# Patient Record
Sex: Male | Born: 1965 | Race: Black or African American | Hispanic: No | Marital: Single | Smoking: Never smoker
Health system: Southern US, Community
[De-identification: ages and names within clinical notes are randomized; demographics above are authoritative.]

## PROBLEM LIST (undated history)

## (undated) DIAGNOSIS — I10 Essential (primary) hypertension: Secondary | ICD-10-CM

## (undated) HISTORY — PX: OTHER SURGICAL HISTORY: SHX169

---

## 1997-08-31 ENCOUNTER — Emergency Department (HOSPITAL_COMMUNITY): Admission: EM | Admit: 1997-08-31 | Discharge: 1997-08-31 | Payer: Self-pay | Admitting: Emergency Medicine

## 1997-09-01 ENCOUNTER — Emergency Department (HOSPITAL_COMMUNITY): Admission: EM | Admit: 1997-09-01 | Discharge: 1997-09-01 | Payer: Self-pay | Admitting: Emergency Medicine

## 2002-01-04 ENCOUNTER — Emergency Department (HOSPITAL_COMMUNITY): Admission: EM | Admit: 2002-01-04 | Discharge: 2002-01-04 | Payer: Self-pay | Admitting: Emergency Medicine

## 2010-03-05 ENCOUNTER — Emergency Department (HOSPITAL_COMMUNITY): Admission: EM | Admit: 2010-03-05 | Discharge: 2010-03-05 | Payer: Self-pay | Admitting: Emergency Medicine

## 2010-08-12 LAB — POCT I-STAT, CHEM 8
Calcium, Ion: 1.21 mmol/L (ref 1.12–1.32)
Chloride: 104 mEq/L (ref 96–112)
Creatinine, Ser: 1.4 mg/dL (ref 0.4–1.5)
Glucose, Bld: 92 mg/dL (ref 70–99)
HCT: 49 % (ref 39.0–52.0)
Potassium: 3.9 mEq/L (ref 3.5–5.1)

## 2010-08-12 LAB — POCT CARDIAC MARKERS: CKMB, poc: <1 ng/mL — ABNORMAL LOW (ref 1.0–8.0)

## 2014-10-21 ENCOUNTER — Ambulatory Visit (INDEPENDENT_AMBULATORY_CARE_PROVIDER_SITE_OTHER): Payer: Self-pay | Admitting: Emergency Medicine

## 2014-10-21 VITALS — BP 213/133 | HR 97 | Temp 99.0°F | Resp 16 | Ht 66.0 in | Wt 170.1 lb

## 2014-10-21 DIAGNOSIS — I10 Essential (primary) hypertension: Secondary | ICD-10-CM

## 2014-10-21 MED ORDER — LOSARTAN POTASSIUM-HCTZ 100-25 MG PO TABS: 1.0000 | ORAL_TABLET | Freq: Every day | ORAL | Status: DC

## 2014-10-21 NOTE — Patient Instructions (Signed)

## 2014-10-21 NOTE — Progress Notes (Signed)
Subjective:  Patient ID: Joseph Adkins, male    DOB: 1966/03/16  Age: 49 y.o. MRN: [id number]  CC: Tingling Down Side Of Legs; Hypertension; and Pain in Joints   HPI Joseph Adkins presents for evaluation of a tick bite occurred 2 weeks ago. He attributes some intermittent tingling in his legs as a sequelae of his tick bite. Had no health maintenance in several years administered doctor in several years has not had any recent lab work he does have a family history is very strong for hypertension and diabetes. He is a nonsmoker. Denies any chest pain, tightness, heaviness, pressure or shortness of breath.  He denies any nausea vomiting or stool change. He is unaware of ever having been diagnosed with hypertension. Has no diabetes or hyperlipidemia.  He has no neurologic or visual symptoms no weakness or visual change has no headaches.  Regarding the tick bite that was 2 weeks ago he has not had a rash, fever or chills. Head and has no other systemic symptoms History Joseph Adkins has no past medical history on file.   He has no past surgical history on file.   His family history includes Diabetes in his maternal grandfather, maternal grandmother, paternal grandfather, and paternal grandmother; Hyperlipidemia in his maternal grandfather; Hypertension in his maternal grandfather and paternal grandfather.He reports that he has never smoked. He has never used smokeless tobacco. He reports that he drinks alcohol. He reports that he does not use illicit drugs.  No outpatient prescriptions prior to visit.   No facility-administered medications prior to visit.    ROS Review of Systems  Constitutional: Negative for fever, chills and appetite change.  HENT: Negative for congestion, ear pain, postnasal drip, sinus pressure and sore throat.   Eyes: Negative for pain and redness.  Respiratory: Negative for cough, shortness of breath and wheezing.   Cardiovascular: Negative for leg swelling.    Gastrointestinal: Negative for nausea, vomiting, abdominal pain, diarrhea, constipation and blood in stool.  Endocrine: Negative for polyuria.  Genitourinary: Negative for dysuria, urgency, frequency and flank pain.  Musculoskeletal: Negative for gait problem.  Skin: Negative for rash.  Neurological: Negative for weakness and headaches.  Psychiatric/Behavioral: Negative for confusion and decreased concentration. The patient is not nervous/anxious.     Objective:  BP 213/133 mmHg  Pulse 97  Temp(Src) 99 F (37.2 C) (Oral)  Resp 16  Ht 5\' 6"  (1.676 m)  Wt 170 lb 2 oz (77.168 kg)  BMI 27.47 kg/m2  SpO2 99%  Physical Exam  Constitutional: He is oriented to person, place, and time. He appears well-developed and well-nourished. No distress.  HENT:  Head: Normocephalic and atraumatic.  Right Ear: External ear normal.  Left Ear: External ear normal.  Nose: Nose normal.  Eyes: Conjunctivae and EOM are normal. Pupils are equal, round, and reactive to light. No scleral icterus.  Neck: Normal range of motion. Neck supple. No tracheal deviation present.  Cardiovascular: Normal rate, regular rhythm and normal heart sounds.   Pulmonary/Chest: Effort normal. No respiratory distress. He has no wheezes. He has no rales.  Abdominal: He exhibits no mass. There is no tenderness. There is no rebound and no guarding.  Musculoskeletal: He exhibits no edema.  Lymphadenopathy:    He has no cervical adenopathy.  Neurological: He is alert and oriented to person, place, and time. Coordination normal.  Skin: Skin is warm and dry. No rash noted.  Psychiatric: He has a normal mood and affect. His behavior is normal.  Assessment & Plan:   Joseph Adkins was seen today for tingling down side of legs, hypertension and pain in joints.  Diagnoses and all orders for this visit:  Essential hypertension, malignant Orders: -     CBC -     Comprehensive metabolic panel -     TSH -     Lipid panel  Other  orders -     losartan-hydrochlorothiazide (HYZAAR) 100-25 MG per tablet; Take 1 tablet by mouth daily.   I am having Joseph Adkins start on losartan-hydrochlorothiazide.  Meds ordered this encounter  Medications  . losartan-hydrochlorothiazide (HYZAAR) 100-25 MG per tablet    Sig: Take 1 tablet by mouth daily.    Dispense:  30 tablet    Refill:  3   Red flag symptoms were discussed with him as well as appropriate action taken.  Follow-up: Return in about 2 weeks (around 11/04/2014).  Roselee Culver, MD

## 2014-10-22 LAB — CBC
HEMATOCRIT: 39.8 % (ref 39.0–52.0)
Hemoglobin: 13.7 g/dL (ref 13.0–17.0)
MCH: 32 pg (ref 26.0–34.0)
MCHC: 34.4 g/dL (ref 30.0–36.0)
MCV: 93 fL (ref 78.0–100.0)
MPV: 9.2 fL (ref 8.6–12.4)
PLATELETS: 313 10*3/uL (ref 150–400)
RBC: 4.28 MIL/uL (ref 4.22–5.81)
RDW: 13 % (ref 11.5–15.5)
WBC: 4.9 10*3/uL (ref 4.0–10.5)

## 2014-10-22 LAB — COMPREHENSIVE METABOLIC PANEL
ALBUMIN: 4.3 g/dL (ref 3.5–5.2)
ALT: 33 U/L (ref 0–53)
AST: 25 U/L (ref 0–37)
Alkaline Phosphatase: 58 U/L (ref 39–117)
BUN: 16 mg/dL (ref 6–23)
CALCIUM: 9.2 mg/dL (ref 8.4–10.5)
CO2: 25 mEq/L (ref 19–32)
CREATININE: 1.34 mg/dL (ref 0.50–1.35)
Chloride: 105 mEq/L (ref 96–112)
GLUCOSE: 107 mg/dL — AB (ref 70–99)
POTASSIUM: 4 meq/L (ref 3.5–5.3)
Sodium: 140 mEq/L (ref 135–145)
TOTAL PROTEIN: 7.7 g/dL (ref 6.0–8.3)
Total Bilirubin: 0.7 mg/dL (ref 0.2–1.2)

## 2014-10-22 LAB — LIPID PANEL
CHOL/HDL RATIO: 3 ratio
Cholesterol: 196 mg/dL (ref 0–200)
HDL: 65 mg/dL (ref >=40)
LDL Cholesterol: 115 mg/dL — ABNORMAL HIGH (ref 0–99)
TRIGLYCERIDES: 82 mg/dL (ref <150)
VLDL: 16 mg/dL (ref 0–40)

## 2014-10-22 LAB — TSH: TSH: 4.407 u[IU]/mL (ref 0.350–4.500)

## 2015-02-24 ENCOUNTER — Other Ambulatory Visit: Payer: Self-pay | Admitting: Emergency Medicine

## 2015-03-23 ENCOUNTER — Other Ambulatory Visit: Payer: Self-pay | Admitting: Physician Assistant

## 2015-04-27 ENCOUNTER — Other Ambulatory Visit: Payer: Self-pay | Admitting: Physician Assistant

## 2015-05-23 ENCOUNTER — Other Ambulatory Visit: Payer: Self-pay | Admitting: Physician Assistant

## 2015-06-23 ENCOUNTER — Ambulatory Visit (INDEPENDENT_AMBULATORY_CARE_PROVIDER_SITE_OTHER): Payer: Self-pay | Admitting: Family Medicine

## 2015-06-23 VITALS — BP 168/108 | HR 86 | Temp 97.8°F | Resp 18 | Ht 66.0 in | Wt 176.2 lb

## 2015-06-23 DIAGNOSIS — I10 Essential (primary) hypertension: Secondary | ICD-10-CM

## 2015-06-23 DIAGNOSIS — H0260 Xanthelasma of unspecified eye, unspecified eyelid: Secondary | ICD-10-CM

## 2015-06-23 DIAGNOSIS — E755 Other lipid storage disorders: Secondary | ICD-10-CM

## 2015-06-23 DIAGNOSIS — Z23 Encounter for immunization: Secondary | ICD-10-CM

## 2015-06-23 DIAGNOSIS — Z1211 Encounter for screening for malignant neoplasm of colon: Secondary | ICD-10-CM

## 2015-06-23 LAB — COMPREHENSIVE METABOLIC PANEL
ALK PHOS: 42 U/L (ref 40–115)
ALT: 19 U/L (ref 9–46)
AST: 17 U/L (ref 10–35)
Albumin: 4.3 g/dL (ref 3.6–5.1)
BILIRUBIN TOTAL: 0.6 mg/dL (ref 0.2–1.2)
BUN: 15 mg/dL (ref 7–25)
CO2: 27 mmol/L (ref 20–31)
CREATININE: 1.23 mg/dL (ref 0.70–1.33)
Calcium: 9.4 mg/dL (ref 8.6–10.3)
Chloride: 100 mmol/L (ref 98–110)
GLUCOSE: 122 mg/dL — AB (ref 65–99)
Potassium: 4.6 mmol/L (ref 3.5–5.3)
SODIUM: 137 mmol/L (ref 135–146)
Total Protein: 7.2 g/dL (ref 6.1–8.1)

## 2015-06-23 LAB — LIPID PANEL
Cholesterol: 204 mg/dL — ABNORMAL HIGH (ref 125–200)
HDL: 76 mg/dL (ref >=40)
LDL CALC: 119 mg/dL (ref <130)
Total CHOL/HDL Ratio: 2.7 Ratio (ref <=5.0)
Triglycerides: 47 mg/dL (ref <150)
VLDL: 9 mg/dL (ref <30)

## 2015-06-23 MED ORDER — LOSARTAN POTASSIUM-HCTZ 100-25 MG PO TABS: ORAL_TABLET | ORAL | Status: DC

## 2015-06-23 NOTE — Progress Notes (Signed)
Patient ID: Joseph Adkins, male    DOB: [birth year]  Age: 50 y.o. MRN: [id number]  Chief Complaint  Patient presents with  . Medication Refill    hyzaar    Subjective:   50 year old man who is here for his blood pressure medication. It ran out 2 days ago. He didn't get in yesterday due to the big crowd. 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 He gets regular exercise. He checks his blood pressure frequently, and usually runs in the 130s over 80s to 90. He feels well and has no major complaints. Cardiovascular, respiratory, GI, GU, neurologic, all essentially normal. He has never had a colonoscopy and knows that he needs to get one some time. He drinks occasional, does not smoke or use drugs. Does not consider himself at any risk of STDs. His labs were all good almost a year ago.  Current allergies, medications, problem list, past/family and social histories reviewed.  Objective:  BP 168/108 mmHg  Pulse 86  Temp(Src) 97.8 F (36.6 C) (Oral)  Resp 18  Ht 5\' 6"  (1.676 m)  Wt 176 lb 3.2 oz (79.924 kg)  BMI 28.45 kg/m2  SpO2 98%  No major acute distress. HEENT normal. Chest clear. Heart regular without murmur. Abdomen soft without mass or tenderness. Extremities without edema.  Assessment & Plan:   Assessment: 1. Essential hypertension   2. Screen for colon cancer   3. Xanthoma of eyelid   4. Needs flu shot       Plan: Get him back on his blood pressure medication and have him monitor it carefully. If it continues to stay will need to alter the medication or add additional med.  Will go ahead and make referral for colonoscopy. Flu shot today.  Orders Placed This Encounter  Procedures  . Flu Vaccine QUAD 36+ mos IM  . Comprehensive metabolic panel  . Lipid panel  . Ambulatory referral to Gastroenterology    Referral Priority:  Routine    Referral Type:  Consultation    Referral Reason:  Specialty Services Required    Number of Visits Requested:  1     No orders of the defined types were placed in this encounter.         Patient Instructions  Take your blood pressure medicine faithfully, losartan HCT 100/25. Monitor your blood pressures. If they are running greater than 140/90 over the next few weeks you need to contact us for a little change of your medication.  Continue regular exercise  Referral will be made for a screening colonoscopy  Flu shot today  Avoid excessive salt  Because the pressure is significantly elevated today I think you should return in about 3 or 4 months for a follow-up visit. In the event of any symptoms since chest pains, excessive headaches, dizziness, etc. please come in sooner.     Return in about 4 months (around 10/21/2015).   Terry Bolotin, MD 06/23/2015

## 2015-06-23 NOTE — Patient Instructions (Addendum)
Take your blood pressure medicine faithfully, losartan HCT 100/25. Monitor your blood pressures. If they are running greater than 140/90 over the next few weeks you need to contact us for a little change of your medication.  Continue regular exercise  Referral will be made for a screening colonoscopy  Flu shot today  Avoid excessive salt  Because the pressure is significantly elevated today I think you should return in about 3 or 4 months for a follow-up visit. In the event of any symptoms since chest pains, excessive headaches, dizziness, etc. please come in sooner.  Continue taking your aspirin a day

## 2016-07-26 ENCOUNTER — Other Ambulatory Visit: Payer: Self-pay | Admitting: Family Medicine

## 2016-07-26 DIAGNOSIS — I10 Essential (primary) hypertension: Secondary | ICD-10-CM

## 2016-10-06 ENCOUNTER — Other Ambulatory Visit: Payer: Self-pay | Admitting: Physician Assistant

## 2016-10-06 DIAGNOSIS — I10 Essential (primary) hypertension: Secondary | ICD-10-CM

## 2016-11-09 ENCOUNTER — Other Ambulatory Visit: Payer: Self-pay | Admitting: Physician Assistant

## 2016-11-09 DIAGNOSIS — I10 Essential (primary) hypertension: Secondary | ICD-10-CM

## 2016-11-15 NOTE — Telephone Encounter (Signed)
Please call pt and let him know they need an office visit for further refills of their bp medications. She has already been given one courtesy refill and now I have given her 15 tablets so she has enough time to schedule the appointment. Thanks!

## 2017-01-05 ENCOUNTER — Observation Stay (HOSPITAL_COMMUNITY): Payer: Self-pay

## 2017-01-05 ENCOUNTER — Encounter (HOSPITAL_COMMUNITY): Payer: Self-pay | Admitting: Emergency Medicine

## 2017-01-05 ENCOUNTER — Emergency Department (HOSPITAL_COMMUNITY): Payer: Self-pay

## 2017-01-05 ENCOUNTER — Observation Stay (HOSPITAL_COMMUNITY)
Admission: EM | Admit: 2017-01-05 | Discharge: 2017-01-06 | Disposition: A | Discharge status: Home / Self Care | Payer: Self-pay | Attending: Internal Medicine | Admitting: Internal Medicine

## 2017-01-05 DIAGNOSIS — Z79899 Other long term (current) drug therapy: Secondary | ICD-10-CM | POA: Insufficient documentation

## 2017-01-05 DIAGNOSIS — I10 Essential (primary) hypertension: Secondary | ICD-10-CM | POA: Insufficient documentation

## 2017-01-05 DIAGNOSIS — R938 Abnormal findings on diagnostic imaging of other specified body structures: Secondary | ICD-10-CM

## 2017-01-05 DIAGNOSIS — R9431 Abnormal electrocardiogram [ECG] [EKG]: Secondary | ICD-10-CM

## 2017-01-05 DIAGNOSIS — R51 Headache: Secondary | ICD-10-CM

## 2017-01-05 DIAGNOSIS — R55 Syncope and collapse: Principal | ICD-10-CM | POA: Insufficient documentation

## 2017-01-05 HISTORY — DX: Essential (primary) hypertension: I10

## 2017-01-05 LAB — CBC
HCT: 39.7 % (ref 39.0–52.0)
HCT: 41.5 % (ref 39.0–52.0)
HEMOGLOBIN: 14.2 g/dL (ref 13.0–17.0)
Hemoglobin: 14.6 g/dL (ref 13.0–17.0)
MCH: 32.7 pg (ref 26.0–34.0)
MCH: 32.3 pg (ref 26.0–34.0)
MCHC: 35.8 g/dL (ref 30.0–36.0)
MCHC: 35.2 g/dL (ref 30.0–36.0)
MCV: 91.5 fL (ref 78.0–100.0)
MCV: 91.8 fL (ref 78.0–100.0)
PLATELETS: 267 10*3/uL (ref 150–400)
Platelets: 268 10*3/uL (ref 150–400)
RBC: 4.34 MIL/uL (ref 4.22–5.81)
RBC: 4.52 MIL/uL (ref 4.22–5.81)
RDW: 12.9 % (ref 11.5–15.5)
RDW: 13 % (ref 11.5–15.5)
WBC: 5.9 10*3/uL (ref 4.0–10.5)
WBC: 6.1 10*3/uL (ref 4.0–10.5)

## 2017-01-05 LAB — BASIC METABOLIC PANEL
ANION GAP: 8 (ref 5–15)
BUN: 13 mg/dL (ref 6–20)
CO2: 22 mmol/L (ref 22–32)
Calcium: 9.3 mg/dL (ref 8.9–10.3)
Chloride: 107 mmol/L (ref 101–111)
Creatinine, Ser: 1.08 mg/dL (ref 0.61–1.24)
GFR calc Af Amer: >60 mL/min (ref >60)
GLUCOSE: 104 mg/dL — AB (ref 65–99)
POTASSIUM: 3.7 mmol/L (ref 3.5–5.1)
Sodium: 137 mmol/L (ref 135–145)

## 2017-01-05 LAB — CREATININE, SERUM
CREATININE: 1.24 mg/dL (ref 0.61–1.24)
GFR calc Af Amer: >60 mL/min (ref >60)

## 2017-01-05 LAB — TSH: TSH: 3.412 u[IU]/mL (ref 0.350–4.500)

## 2017-01-05 LAB — I-STAT TROPONIN, ED: TROPONIN I, POC: 0 ng/mL (ref 0.00–0.08)

## 2017-01-05 LAB — TROPONIN I

## 2017-01-05 MED ORDER — SODIUM CHLORIDE 0.9 % IV SOLN: 250.0000 mL | INTRAVENOUS | Status: DC | PRN

## 2017-01-05 MED ORDER — SODIUM CHLORIDE 0.9% FLUSH: 3.0000 mL | INTRAVENOUS | Status: DC | PRN

## 2017-01-05 MED ORDER — ENOXAPARIN SODIUM 40 MG/0.4ML ~~LOC~~ SOLN
40.0000 mg | SUBCUTANEOUS | Status: DC
  Administered 2017-01-05: 40 mg via SUBCUTANEOUS
  Filled 2017-01-05: qty 0.4

## 2017-01-05 MED ORDER — LOSARTAN POTASSIUM 50 MG PO TABS
100.0000 mg | ORAL_TABLET | Freq: Every day | ORAL | Status: DC
  Administered 2017-01-06: 100 mg via ORAL
  Filled 2017-01-05: qty 2

## 2017-01-05 MED ORDER — ONDANSETRON HCL 4 MG/2ML IJ SOLN: 4.0000 mg | Freq: Four times a day (QID) | INTRAMUSCULAR | Status: DC | PRN

## 2017-01-05 MED ORDER — HYDROCHLOROTHIAZIDE 25 MG PO TABS
25.0000 mg | ORAL_TABLET | Freq: Every day | ORAL | Status: DC
  Administered 2017-01-06: 25 mg via ORAL
  Filled 2017-01-05: qty 1

## 2017-01-05 MED ORDER — SODIUM CHLORIDE 0.9% FLUSH
3.0000 mL | Freq: Two times a day (BID) | INTRAVENOUS | Status: DC
  Administered 2017-01-06: 3 mL via INTRAVENOUS

## 2017-01-05 MED ORDER — HYDRALAZINE HCL 20 MG/ML IJ SOLN: 10.0000 mg | Freq: Four times a day (QID) | INTRAMUSCULAR | Status: DC | PRN

## 2017-01-05 MED ORDER — ACETAMINOPHEN 650 MG RE SUPP: 650.0000 mg | Freq: Four times a day (QID) | RECTAL | Status: DC | PRN

## 2017-01-05 MED ORDER — LOSARTAN POTASSIUM-HCTZ 100-25 MG PO TABS: 1.0000 | ORAL_TABLET | Freq: Every day | ORAL | Status: DC

## 2017-01-05 MED ORDER — ACETAMINOPHEN 325 MG PO TABS: 650.0000 mg | ORAL_TABLET | Freq: Four times a day (QID) | ORAL | Status: DC | PRN

## 2017-01-05 MED ORDER — SODIUM CHLORIDE 0.9% FLUSH
3.0000 mL | Freq: Two times a day (BID) | INTRAVENOUS | Status: DC
  Administered 2017-01-05: 3 mL via INTRAVENOUS

## 2017-01-05 MED ORDER — ONDANSETRON HCL 4 MG PO TABS: 4.0000 mg | ORAL_TABLET | Freq: Four times a day (QID) | ORAL | Status: DC | PRN

## 2017-01-05 NOTE — ED Notes (Signed)
Patient transported to X-ray 

## 2017-01-05 NOTE — ED Triage Notes (Signed)
Pt reports Friday had near syncopal episode after being outside. Pt felt better. Then had a near syncopal episode on Monday. Today he felt warm and put ice around his neck bc he felt like he was going to pass out again. Pt states he felt better after the ice. No weakness or feeling like he is going to pass out currently.

## 2017-01-05 NOTE — H&P (Signed)
[facility] History and Physical  ZADE FALKNER [id number] DOB: 1965-07-31 DOA: 01/05/2017  PCP: Patient, No Pcp Per  Patient coming from: Home  Chief Complaint: Near syncope  HPI: Joseph Adkins is a 51 year old male with history of hypertension presented to the emergency department with complaints of near syncopal events. Patient states his first event occurred on July 4th while he was at a party. Patient remembers being outside with friends and feeling very hot and warm. Once he was able to sit down and cool off, he no longer felt this faint feeling. Patient does recall having headache prior to this event. Patient also had episode earlier this week on Monday, at which point he was at work. He recalls having a frontal headache, 10/10 pain, prior to the event. He states that he went and sat in the cooler and use an ice pack on his neck at which point this faint feeling resolved. Third faint or near syncopal event occurred yesterday on 01/04/2017. Patient recalls having a mild headache, rated at 2/10, in the frontal region. He remembers having to sit down for a while before his symptoms went away. Patient states his headaches have always been frontal and bilateral, denies any throbbing or sharp pain. Denies any radiation of symptoms. Denies any sensitivity to light or sound. Patient denied any chest pain or shortness of breath, nausea or vomiting, diarrhea with the event. Patient denies any recent changes in medications. He does admit to not taking his hypertensive medications for the last few days as he ran out. Patient denies any recent bowel, ill contacts, illness. Currently denies chest pain, shortness of breath, abdominal pain, nausea or vomiting, diarrhea constipation, changes in urinary habits, dizziness, current headache.   ED Course: Found to have elevated blood pressure with complaints of near syncope. [facility] called for admission.  Review of Systems:  All other systems reviewed  and are negative.   Past Medical History:  Diagnosis Date  . Hypertension     No past surgical history on file.  Social History:  reports that he has never smoked. He has never used smokeless tobacco. He reports that he drinks alcohol. He reports that he does not use drugs.  No Known Allergies  Family History  Problem Relation Age of Onset  . Diabetes Maternal Grandmother   . Diabetes Maternal Grandfather   . Hypertension Maternal Grandfather   . Hyperlipidemia Maternal Grandfather   . Diabetes Paternal Grandmother   . Diabetes Paternal Grandfather   . Hypertension Paternal Grandfather      Prior to Admission medications   Medication Sig Start Date End Date Taking? Authorizing Provider  losartan-hydrochlorothiazide (HYZAAR) 100-25 MG tablet TAKE 1 TABLET BY MOUTH EVERY DAY 11/15/16   Leonie Douglas, PA-C    Physical Exam: Vitals:   01/05/17 1615 01/05/17 1645  BP: (!) 175/97 (!) 170/97  Pulse: 90 97  Resp: 16 17  Temp:    SpO2: 98% 97%    General: Well developed, well nourished, NAD, appears stated age  HEENT: NCAT, PERRLA, EOMI, Anicteic Sclera, mucous membranes moist.   Neck: Supple, no JVD, no masses  Cardiovascular: S1 S2 auscultated, no rubs, murmurs or gallops. Regular rate and rhythm.  Respiratory: Clear to auscultation bilaterally with equal chest rise  Abdomen: Soft, nontender, nondistended, + bowel sounds  Extremities: warm dry without cyanosis clubbing or edema  Neuro: AAOx3, cranial nerves grossly intact. Strength 5/5 in patient's upper and lower extremities bilaterally  Skin: Without rashes exudates or nodules  Psych: Normal affect and demeanor with intact judgement and insight  Labs on Admission: I have personally reviewed following labs and imaging studies CBC:  Recent Labs Lab 01/05/17 1236  WBC 5.9  HGB 14.2  HCT 39.7  MCV 91.5  PLT 154   Basic Metabolic Panel:  Recent Labs Lab 01/05/17 1236  NA 137  K 3.7  CL 107    CO2 22  GLUCOSE 104*  BUN 13  CREATININE 1.08  CALCIUM 9.3   GFR: Estimated Creatinine Clearance: 71.4 mL/min (by C-G formula based on SCr of 1.08 mg/dL). Liver Function Tests: No results for input(s): AST, ALT, ALKPHOS, BILITOT, PROT, ALBUMIN in the last 168 hours. No results for input(s): LIPASE, AMYLASE in the last 168 hours. No results for input(s): AMMONIA in the last 168 hours. Coagulation Profile: No results for input(s): INR, PROTIME in the last 168 hours. Cardiac Enzymes: No results for input(s): CKTOTAL, CKMB, CKMBINDEX, TROPONINI in the last 168 hours. BNP (last 3 results) No results for input(s): PROBNP in the last 8760 hours. HbA1C: No results for input(s): HGBA1C in the last 72 hours. CBG: No results for input(s): GLUCAP in the last 168 hours. Lipid Profile: No results for input(s): CHOL, HDL, LDLCALC, TRIG, CHOLHDL, LDLDIRECT in the last 72 hours. Thyroid Function Tests: No results for input(s): TSH, T4TOTAL, FREET4, T3FREE, THYROIDAB in the last 72 hours. Anemia Panel: No results for input(s): VITAMINB12, FOLATE, FERRITIN, TIBC, IRON, RETICCTPCT in the last 72 hours. Urine analysis: No results found for: COLORURINE, APPEARANCEUR, LABSPEC, PHURINE, GLUCOSEU, HGBUR, BILIRUBINUR, KETONESUR, PROTEINUR, UROBILINOGEN, NITRITE, LEUKOCYTESUR Sepsis Labs: @LABRCNTIP (procalcitonin:4,lacticidven:4) )No results found for this or any previous visit (from the past 240 hour(s)).   Radiological Exams on Admission: Dg Chest 2 View  Result Date: 01/05/2017 CLINICAL DATA:  Faint feelings, near syncopal EXAM: CHEST  2 VIEW COMPARISON:  None FINDINGS: Normal heart size, mediastinal contours, and pulmonary vascularity. Lungs clear. No pleural effusion or pneumothorax. Question subtle LEFT nipple shadow. Bones unremarkable. IMPRESSION: No acute abnormalities. **An incidental finding of potential clinical significance has been found. Question LEFT nipple shadow. Repeat PA chest  radiograph with nipple markers recommended to exclude pulmonary nodule.** Electronically Signed   By: Lavonia Dana M.D.   On: 01/05/2017 16:38    EKG: Independently reviewed. Sinus rhythm, rate 93. Question ST elevations in V1/V2, lateral TWI  Assessment/Plan Near Syncope -Has occurred 3 times since November 30, 2016.  -Patient has not lost consciousness. -Patient does have headache prior to event. -Will admit patient to telemetry. -Continue neuro checks -obtain orthostatic vitals on admission -Will obtain echocardiogram and carotid Doppler -CT head pending  Essential Hypertension/Accelerated Hypertension -Patient ran out of his medication several days ago -Will continue home medication of losartan/HCTZ -Placed on IV hydralazine as needed  Headache -Question whether his headache is due to elevated blood pressure versus possible dehydration -CT head pending -Currently not experiencing headache  Abnormal EKG -Discussed with cardiology, Dr. Oval Linsey. In the absence of chest pain, recommended cycling troponins. Possibly early repolarization.  -Echocardiogram ordered -First troponin negative, will continue to cycle.  -no previous EKG for comparison -Currently denies chest pain  Chest xray irregularity -will obtain repeat CXR in the morning -negative for infiltration or infection -currently no complaints of cough or shortness of breath -radiology reading: No acute abnormalities, question left nipple shadow, recommended repeat chest x-ray with nipple markers   DVT prophylaxis: Lovenox  Code Status: Full  Family Communication: None at bedside. Admission, patients condition and plan of care including  tests being ordered have been discussed with the patient, who indicates understanding and agrees with the plan and Code Status.  Disposition Plan: Home   Consults called: None   Admission status: Observation.    Time spent: 60 minutes  Sonnie Bias D.O. [facility] Pager [phone]  If 7PM-7AM, please contact night-coverage [email or web address] Password [facility] 01/05/2017, 5:36 PM

## 2017-01-05 NOTE — ED Provider Notes (Signed)
Patient is a syncopal event today 11 AM. He felt lightheaded immediately before the event. He also had a near syncopal event yesterday and near syncopal event on Monday, 01/02/2017.Marland Kitchen He had no headache today he did report slight headache preceding the near syncopal events yesterday in 3 days ago. Had severe headache preceding nearsyncopal eventon 11/30/2016. He is presently asymptomatichistory of hypertension. He's been noncompliant with his medications for the past 2 days as he has run outhe has not had a headache at all today. No treatment prior to coming here on exam alert no distresslungs clear auscultation heart regular rate and rhythm no murmurs abdomen nondistended nontender extremities without edema neurologic Glasgow Coma Score 15 cranial nerves II through XII intact motor strength 5 over 5 overall moves all extremities well well   Orlie Dakin, MD 01/05/17 1646

## 2017-01-05 NOTE — ED Notes (Signed)
Attempted report 

## 2017-01-05 NOTE — ED Notes (Signed)
Attempted report x 2 

## 2017-01-05 NOTE — ED Notes (Signed)
Patient transported to CT 

## 2017-01-05 NOTE — ED Provider Notes (Signed)
[facility] DEPT Provider Note   CSN: [id number] Arrival date & time: 01/05/17  1144     History   Chief Complaint Chief Complaint  Patient presents with  . Near Syncope    HPI Joseph Adkins is a 51 y.o. male. With history of HTN who presents with complaints of near syncope. Patient reports several episodes of maximal intensity at onset HA with subsequent sensation of almost passing out. First episode was on 4th of July, patient reports being outdoors after 2 beers and developed 10/10 diffuse headache with subsequent sensation that he was going to pass out. He was with nurses who thought he had heat exhaustion - symptoms improved with resting in cool air conditioning. 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 Describes sudden onset of 6/10 headache with subsequent near syncopal symptoms. Recurrence Wednesday and this morning.  He denies any chest pain, shortness of breath or palpitations. Denies any weakness, or changes in vision. Has had some tingling in his left lower extremity this week. Denies family history of brain disorders/aneurysms. He is currently asymptomatic.   HPI  Past Medical History:  Diagnosis Date  . Hypertension     Patient Active Problem List   Diagnosis Date Noted  . Near syncope 01/05/2017    No past surgical history on file.     Home Medications    Prior to Admission medications   Medication Sig Start Date End Date Taking? Authorizing Provider  aspirin 325 MG tablet Take 325 mg by mouth daily as needed for mild pain or headache.    Yes [provider]  losartan-hydrochlorothiazide (HYZAAR) 100-25 MG tablet TAKE 1 TABLET BY MOUTH EVERY DAY 11/15/16  Yes Leonie Douglas, PA-C    Family History Family History  Problem Relation Age of Onset  . Diabetes Maternal Grandmother   . Diabetes Maternal Grandfather   . Hypertension Maternal Grandfather   . Hyperlipidemia Maternal Grandfather   . Diabetes Paternal  Grandmother   . Diabetes Paternal Grandfather   . Hypertension Paternal Grandfather     Social History Social History  Substance Use Topics  . Smoking status: Never Smoker  . Smokeless tobacco: Never Used  . Alcohol use 0.0 oz/week     Allergies   Patient has no known allergies.   Review of Systems Review of Systems  Constitutional: Negative for chills and fever.  HENT: Negative for ear pain and sore throat.   Eyes: Negative for pain and visual disturbance.  Respiratory: Negative for cough and shortness of breath.   Cardiovascular: Negative for chest pain and palpitations.  Gastrointestinal: Negative for abdominal pain and vomiting.  Genitourinary: Negative for dysuria and hematuria.  Musculoskeletal: Negative for arthralgias and back pain.  Skin: Negative for color change and rash.  Neurological: Positive for syncope (near syncope), light-headedness, numbness (tingling in LLE) and headaches. Negative for seizures.  All other systems reviewed and are negative.    Physical Exam Updated Vital Signs BP (!) 153/92 (BP Location: Right Arm)   Pulse 83   Temp 98.4 F (36.9 C) (Oral)   Resp 16   Ht 5\' 6"  (1.676 m)   Wt 71.5 kg (157 lb 11.2 oz)   SpO2 100%   BMI 25.45 kg/m   Physical Exam  Constitutional: He appears well-developed and well-nourished.  HENT:  Head: Normocephalic and atraumatic.  Eyes: Conjunctivae are normal.  Neck: Neck supple.  Cardiovascular: Normal rate and regular rhythm.   No murmur heard. Pulmonary/Chest: Effort normal  and breath sounds normal. No respiratory distress.  Abdominal: Soft. There is no tenderness.  Musculoskeletal: He exhibits no edema.  Neurological: He is alert. He has normal strength. No cranial nerve deficit or sensory deficit. He displays a negative Romberg sign. Coordination and gait normal. GCS eye subscore is 4. GCS verbal subscore is 5. GCS motor subscore is 6.  Skin: Skin is warm and dry.  Psychiatric: He has a normal  mood and affect.  Nursing note and vitals reviewed.    ED Treatments / Results  Labs (all labs ordered are listed, but only abnormal results are displayed) Labs Reviewed  BASIC METABOLIC PANEL - Abnormal; Notable for the following:       Result Value   Glucose, Bld 104 (*)    All other components within normal limits  CBC  TROPONIN I  CBC  CREATININE, SERUM  TSH  TROPONIN I  TROPONIN I  HIV ANTIBODY (ROUTINE TESTING)  BASIC METABOLIC PANEL  CBC  I-STAT TROPONIN, ED    EKG  EKG Interpretation  Date/Time:  Thursday January 05 2017 12:32:55 EDT Ventricular Rate:  93 PR Interval:  134 QRS Duration: 78 QT Interval:  350 QTC Calculation: 435 R Axis:   -36 Text Interpretation:  Normal sinus rhythm Possible Left atrial enlargement Left axis deviation Septal infarct , age undetermined T wave abnormality, consider lateral ischemia Abnormal ECG No significant change since last tracing Confirmed by Orlie Dakin [phone]) on 01/05/2017 4:37:15 PM       Radiology Dg Chest 2 View  Result Date: 01/05/2017 CLINICAL DATA:  Acute onset of syncope and lightheadedness. Mild headache. Initial encounter. EXAM: CHEST  2 VIEW COMPARISON:  Chest radiograph performed earlier today at 4:22 p.m. FINDINGS: The lungs are well-aerated and clear. There is no evidence of focal opacification, pleural effusion or pneumothorax. The heart is normal in size; the mediastinal contour is within normal limits. No acute osseous abnormalities are seen. IMPRESSION: No acute cardiopulmonary process seen. Electronically Signed   By: Garald Balding M.D.   On: 01/05/2017 21:36   Dg Chest 2 View  Result Date: 01/05/2017 CLINICAL DATA:  Faint feelings, near syncopal EXAM: CHEST  2 VIEW COMPARISON:  None FINDINGS: Normal heart size, mediastinal contours, and pulmonary vascularity. Lungs clear. No pleural effusion or pneumothorax. Question subtle LEFT nipple shadow. Bones unremarkable. IMPRESSION: No acute abnormalities. **An  incidental finding of potential clinical significance has been found. Question LEFT nipple shadow. Repeat PA chest radiograph with nipple markers recommended to exclude pulmonary nodule.** Electronically Signed   By: Lavonia Dana M.D.   On: 01/05/2017 16:38   Ct Head Wo Contrast  Result Date: 01/05/2017 CLINICAL DATA:  Near syncope EXAM: CT HEAD WITHOUT CONTRAST TECHNIQUE: Contiguous axial images were obtained from the base of the skull through the vertex without intravenous contrast. COMPARISON:  None. FINDINGS: Brain: There is no evidence for acute hemorrhage, hydrocephalus, mass lesion, or abnormal extra-axial fluid collection. No definite CT evidence for acute infarction. Vascular: No hyperdense vessel or unexpected calcification. Skull: Normal. Negative for fracture or focal lesion. Sinuses/Orbits: No acute finding. Other: None. IMPRESSION: Unremarkable noncontrast CT exam of the brain. Electronically Signed   By: Misty Stanley M.D.   On: 01/05/2017 18:10    Procedures Procedures (including critical care time)  Medications Ordered in ED Medications  sodium chloride flush (NS) 0.9 % injection 3 mL (not administered)  enoxaparin (LOVENOX) injection 40 mg (40 mg Subcutaneous Given 01/05/17 2245)  sodium chloride flush (NS) 0.9 % injection  3 mL (not administered)  sodium chloride flush (NS) 0.9 % injection 3 mL (not administered)  0.9 %  sodium chloride infusion (not administered)  acetaminophen (TYLENOL) tablet 650 mg (not administered)    Or  acetaminophen (TYLENOL) suppository 650 mg (not administered)  ondansetron (ZOFRAN) tablet 4 mg (not administered)    Or  ondansetron (ZOFRAN) injection 4 mg (not administered)  hydrALAZINE (APRESOLINE) injection 10 mg (not administered)  losartan (COZAAR) tablet 100 mg (not administered)    And  hydrochlorothiazide (HYDRODIURIL) tablet 25 mg (not administered)     Initial Impression / Assessment and Plan / ED Course  I have reviewed the triage  vital signs and the nursing notes.  Pertinent labs & imaging results that were available during my care of the patient were reviewed by me and considered in my medical decision making (see chart for details).     Patient is a 51 y/o male With history of hypertension who presents with several near syncopal events, 3 in the past week. Events have been associated with a severe headache. Patient arrived hypertensive with systolic blood pressure running between 409W to 119J systolic, otherwise hemodynamically stable. He does admit to running out of her pressure medications for the past 2 days. Exam as above, nonfocal neurological exam.  Basic labs reassuring. Chest x-ray without acute findings. EKG with lateral T-wave flattening and flipped T waves, stable from 2011.  Considered subarachnoid hemorrhage, however nonfocal neurological exam, and Near syncopal event today was without a headache. EKG stable, however it is abnormal. Given this is patient's third near syncopal event this week, I think he warrants admission for further syncopal workup including cardiac monitoring, echo, carotid duplex. Discussed with hospitalist for admission.  Patient and plan of care discussed with Attending physician, Dr. Winfred Leeds.      Final Clinical Impressions(s) / ED Diagnoses   Final diagnoses:  Near syncope    New Prescriptions Current Discharge Medication List       Arnetha Massy, MD 01/06/17 0109    Orlie Dakin, MD 01/07/17 [phone]

## 2017-01-06 ENCOUNTER — Encounter (HOSPITAL_COMMUNITY): Payer: Self-pay

## 2017-01-06 ENCOUNTER — Observation Stay (HOSPITAL_BASED_OUTPATIENT_CLINIC_OR_DEPARTMENT_OTHER): Payer: Self-pay

## 2017-01-06 DIAGNOSIS — I5032 Chronic diastolic (congestive) heart failure: Secondary | ICD-10-CM

## 2017-01-06 DIAGNOSIS — R55 Syncope and collapse: Secondary | ICD-10-CM

## 2017-01-06 LAB — BASIC METABOLIC PANEL
ANION GAP: 6 (ref 5–15)
BUN: 14 mg/dL (ref 6–20)
CALCIUM: 9.2 mg/dL (ref 8.9–10.3)
CO2: 28 mmol/L (ref 22–32)
CREATININE: 1.27 mg/dL — AB (ref 0.61–1.24)
Chloride: 106 mmol/L (ref 101–111)
GFR calc Af Amer: >60 mL/min (ref >60)
GFR calc non Af Amer: >60 mL/min (ref >60)
GLUCOSE: 102 mg/dL — AB (ref 65–99)
Potassium: 4.1 mmol/L (ref 3.5–5.1)
Sodium: 140 mmol/L (ref 135–145)

## 2017-01-06 LAB — HIV ANTIBODY (ROUTINE TESTING W REFLEX): HIV Screen 4th Generation wRfx: NONREACTIVE

## 2017-01-06 LAB — VAS US CAROTID
LCCADDIAS: -32 cm/s
LCCAPDIAS: 30 cm/s
LCCAPSYS: 114 cm/s
LEFT ECA DIAS: -17 cm/s
LEFT VERTEBRAL DIAS: -14 cm/s
LICADDIAS: -28 cm/s
LICADSYS: -64 cm/s
LICAPSYS: -90 cm/s
Left CCA dist sys: -103 cm/s
Left ICA prox dias: -38 cm/s
RIGHT ECA DIAS: -10 cm/s
RIGHT VERTEBRAL DIAS: -17 cm/s
Right CCA prox dias: 19 cm/s
Right CCA prox sys: 78 cm/s
Right cca dist sys: -81 cm/s

## 2017-01-06 LAB — ECHOCARDIOGRAM COMPLETE
CHL CUP MV DEC (S): 271
CHL CUP STROKE VOLUME: 44 mL
EERAT: 7.16
EWDT: 271 ms
FS: 39 % (ref 28–44)
Height: 66 in
IV/PV OW: 0.95
LA ID, A-P, ES: 39 mm
LADIAMINDEX: 2.14 cm/m2
LAVOL: 48.3 mL
LAVOLA4C: 31.6 mL
LAVOLIN: 26.5 mL/m2
LDCA: 3.8 cm2
LEFT ATRIUM END SYS DIAM: 39 mm
LV E/e' medial: 7.16
LV E/e'average: 7.16
LV SIMPSON'S DISK: 50
LV TDI E'LATERAL: 8.81
LV sys vol index: 24 mL/m2
LV sys vol: 44 mL (ref 21–61)
LVDIAVOL: 87 mL (ref 62–150)
LVDIAVOLIN: 48 mL/m2
LVELAT: 8.81 cm/s
LVOTD: 22 mm
Lateral S' vel: 10.1 cm/s
MV pk A vel: 57.7 m/s
MV pk E vel: 63.1 m/s
PW: 11.1 mm — AB (ref 0.6–1.1)
TAPSE: 19.2 mm
TDI e' medial: 5.11
Weight: 2564.8 oz

## 2017-01-06 LAB — CBC
HCT: 40.8 % (ref 39.0–52.0)
Hemoglobin: 14.4 g/dL (ref 13.0–17.0)
MCH: 32.4 pg (ref 26.0–34.0)
MCHC: 35.3 g/dL (ref 30.0–36.0)
MCV: 91.7 fL (ref 78.0–100.0)
PLATELETS: 266 10*3/uL (ref 150–400)
RBC: 4.45 MIL/uL (ref 4.22–5.81)
RDW: 13 % (ref 11.5–15.5)
WBC: 4.6 10*3/uL (ref 4.0–10.5)

## 2017-01-06 LAB — TROPONIN I

## 2017-01-06 MED ORDER — LOSARTAN POTASSIUM-HCTZ 100-25 MG PO TABS: 1.0000 | ORAL_TABLET | Freq: Every day | ORAL | 0 refills | Status: DC

## 2017-01-06 NOTE — Discharge Instructions (Signed)
Near-Syncope °Near-syncope is when you suddenly get weak or dizzy, or you feel like you might pass out (faint). During an episode of near-syncope, you may: °· Feel dizzy or light-headed. °· Feel sick to your stomach (nauseous). °· See all white or all black. °· Have cold, clammy skin. ° °If you passed out, get help right away.Call your local emergency services (911 in the [place] Do not drive yourself to the hospital. °Follow these instructions at home: °Pay attention to any changes in your symptoms. Take these actions to help with your condition: °· Have someone stay with you until you feel stable. °· Do not drive, use machinery, or play sports until your doctor says it is okay. °· Keep all follow-up visits as told by your doctor. This is important. °· If you start to feel like you might pass out, lie down right away and raise (elevate) your feet above the level of your heart. Breathe deeply and steadily. Wait until all of the symptoms are gone. °· Drink enough fluid to keep your pee (urine) clear or pale yellow. °· If you are taking blood pressure or heart medicine, get up slowly and spend many minutes getting ready to sit and then stand. This can help with dizziness. °· Take over-the-counter and prescription medicines only as told by your doctor. ° °Get help right away if: °· You have a very bad headache. °· You have unusual pain in your chest, tummy, or back. °· You are bleeding from your mouth or rectum. °· You have black or tarry poop (stool). °· You have a very fast or uneven heartbeat (palpitations). °· You pass out one time or more than once. °· You have jerky movements that you cannot control (seizure). °· You are confused. °· You have trouble walking. °· You are very weak. °· You have vision problems. °These symptoms may be an emergency. Do not wait to see if the symptoms will go away. Get medical help right away. Call your local emergency services (911 in the [place] Do not drive yourself to the  hospital. °This information is not intended to replace advice given to you by your health care provider. Make sure you discuss any questions you have with your health care provider. °Document Released: 11/02/2007 Document Revised: 10/22/2015 Document Reviewed: 01/28/2015 °Elsevier Interactive Patient Education © 2017 [employer] Inc. ° °

## 2017-01-06 NOTE — Plan of Care (Signed)
Problem: Physical Regulation: Goal: Ability to maintain clinical measurements within normal limits will improve Outcome: Progressing Pt tolerating activity. Denies any Dizziness, SOB, or CP. Ambulating to BR w/o complication. VSS. Continue to monitor.

## 2017-01-06 NOTE — Progress Notes (Signed)
Patient received discharge information and all questions were answered. Patient IV was removed.

## 2017-01-06 NOTE — Progress Notes (Signed)
**  Preliminary report by tech**  Carotid artery duplex complete. Findings are consistent with a 1-39 percent stenosis involving the right internal carotid artery and the left internal carotid artery. The vertebral arteries demonstrate antegrade flow.  01/06/17 10:19 AM Carlos Levering RVT

## 2017-01-06 NOTE — Care Management Note (Signed)
Case Management Note  Patient Details  Name: Joseph Adkins MRN: [id number] Date of Birth: January 04, 1966  Subjective/Objective:    HTN                Action/Plan: Discharge Planning: NCM spoke to pt and states he goes to [facility]. States he works full-time but does not have Copy. His Hyzar was $40 at pharmacy. States he was able to afford medications.    Expected Discharge Date:  01/06/17               Expected Discharge Plan:  Home/Self Care  In-House Referral:  NA  Discharge planning Services  CM Consult  Post Acute Care Choice:  NA Choice offered to:  NA  DME Arranged:  N/A DME Agency:  NA  HH Arranged:  NA HH Agency:  NA  Status of Service:  Completed, signed off  If discussed at [place] of Stay Meetings, dates discussed:    Additional Comments:  Erenest Rasher, RN 01/06/2017, 3:02 PM

## 2017-01-06 NOTE — Discharge Summary (Signed)
Physician Discharge Summary  [name] HAL:[id number] DOB: Jan 02, 1966 DOA: 01/05/2017  PCP: Patient, No Pcp Per  Admit date: 01/05/2017 Discharge date: 01/06/2017  Time spent: 45 minutes  Recommendations for Outpatient Follow-up:  Patient will be discharged to home.  Patient will need to follow up with primary care provider within one week of discharge.  Follow up with cardiology, Dr. Oval Linsey, in 1-2 weeks. Patient should continue medications as prescribed.  Patient should follow a heart healthy diet. Do not drive.   Discharge Diagnoses:  Near syncope Essential hypertension/accelerated hypertension Headache Abnormal EKG Chest x-ray irregularity  Discharge Condition: Stable  Diet recommendation: Heart healthy  Filed Weights   01/05/17 1222 01/05/17 2013 01/06/17 0452  Weight: 74.8 kg (165 lb) 71.5 kg (157 lb 11.2 oz) 72.7 kg (160 lb 4.8 oz)    History of present illness: On 01/05/2017 Joseph Adkins is a 51 year old male with history of hypertension presented to the emergency department with complaints of near syncopal events. Patient states his first event occurred on July 4th while he was at a party. Patient remembers being outside with friends and feeling very hot and warm. Once he was able to sit down and cool off, he no longer felt this faint feeling. Patient does recall having headache prior to this event. Patient also had episode earlier this week on Monday, at which point he was at work. He recalls having a frontal headache, 10/10 pain, prior to the event. He states that he went and sat in the cooler and use an ice pack on his neck at which point this faint feeling resolved. Third faint or near syncopal event occurred yesterday on 01/04/2017. Patient recalls having a mild headache, rated at 2/10, in the frontal region. He remembers having to sit down for a while before his symptoms went away. Patient states his headaches have always been frontal and bilateral, denies any  throbbing or sharp pain. Denies any radiation of symptoms. Denies any sensitivity to light or sound. Patient denied any chest pain or shortness of breath, nausea or vomiting, diarrhea with the event. Patient denies any recent changes in medications. He does admit to not taking his hypertensive medications for the last few days as he ran out. Patient denies any recent bowel, ill contacts, illness. Currently denies chest pain, shortness of breath, abdominal pain, nausea or vomiting, diarrhea constipation, changes in urinary habits, dizziness, current headache.   Hospital Course:  Near Syncope -Has occurred 3 times since November 30, 2016.  -Patient has not lost consciousness. -Patient does have headache prior to event and events have been related to heat. -CT head unremarkable -Orthostatic vitals unremarkable -Cardiac Doppler showed 1-39% stenosis of bilateral internal carotid arteries, vertebral arteries demonstrate antegrade flow. -Echocardiogram showed EF of 73-53%, grade 1 diastolic dysfunction. Moderate concentric hypertrophy -Discussed not driving with the patient -?if this is related to uncontrolled hypertension vs heat exhaustion  -patient states that as he cools himself off, he begins to feel better -Recommended patient stay hydrated, and supplement his electrolytes (gatorade)  Essential Hypertension/Accelerated Hypertension -Patient ran out of his medication several days prior to admission -BP on admission to the emergency department was 206/120 -Restarted patient's home medication of losartan/HCTZ, BP improved  Headache -Question whether his headache is due to elevated blood pressure versus possible dehydration -CT head unremarkable -Currently not experiencing headache  Abnormal EKG -Discussed with cardiology, Dr. Oval Linsey. In the absence of chest pain, recommended cycling troponins. Possibly early repolarization.  -Echocardiogram as above -Troponin cycled and  unremarkable -no  previous EKG for comparison -Currently denies chest pain -Patient follow-up with Dr. Oval Linsey as an outpatient  Chest xray irregularity -radiology reading: No acute abnormalities, question left nipple shadow, recommended repeat chest x-ray with nipple markers -Repeat chest x-ray today unremarkable for infection or abnormalities  Procedures: Echocardiogram Carotid doppler  Consultations: Cardiology, via phone, Dr. Oval Linsey  Discharge Exam: Vitals:   01/05/17 2355 01/06/17 1300  BP: (!) 153/92 (!) 151/98  Pulse:  75  Resp:  20  Temp:  98.1 F (36.7 C)  SpO2:  98%   Patient feeling better today, denies chest pain, shortness of breath, abdominal pain, N/V/D/C, dizziness or headache.   General: Well developed, well nourished, NAD, appears stated age  40: NCAT, mucous membranes moist.  Cardiovascular: S1 S2 auscultated, RRR, no murmurs  Respiratory: Clear to auscultation bilaterally with equal chest rise  Abdomen: Soft, nontender, nondistended, + bowel sounds  Extremities: warm dry without cyanosis clubbing or edema  Neuro: AAOx3, nonfocal  Psych: Normal affect and demeanor with intact judgement and insight  Discharge Instructions Discharge Instructions    Discharge instructions    Complete by:  As directed    Patient will be discharged to home.  Patient will need to follow up with primary care provider within one week of discharge.  Follow up with cardiology, Dr. Oval Linsey, in 1-2 weeks. Patient should continue medications as prescribed.  Patient should follow a heart healthy diet.     Current Discharge Medication List    CONTINUE these medications which have CHANGED   Details  losartan-hydrochlorothiazide (HYZAAR) 100-25 MG tablet Take 1 tablet by mouth daily. Qty: 30 tablet, Refills: 0   Associated Diagnoses: Essential hypertension      CONTINUE these medications which have NOT CHANGED   Details  aspirin 325 MG tablet Take 325 mg by mouth daily as needed  for mild pain or headache.        No Known Allergies Follow-up Information    Primary care physician. Schedule an appointment as soon as possible for a visit in 1 week(s).   Why:  Hospital follow up       Skeet Latch, MD. Schedule an appointment as soon as possible for a visit.   Specialty:  Cardiology Why:  Hospital follow up Contact information: [address] [place] [address] [phone]            The results of significant diagnostics from this hospitalization (including imaging, microbiology, ancillary and laboratory) are listed below for reference.    Significant Diagnostic Studies: Dg Chest 2 View  Result Date: 01/05/2017 CLINICAL DATA:  Acute onset of syncope and lightheadedness. Mild headache. Initial encounter. EXAM: CHEST  2 VIEW COMPARISON:  Chest radiograph performed earlier today at 4:22 p.m. FINDINGS: The lungs are well-aerated and clear. There is no evidence of focal opacification, pleural effusion or pneumothorax. The heart is normal in size; the mediastinal contour is within normal limits. No acute osseous abnormalities are seen. IMPRESSION: No acute cardiopulmonary process seen. Electronically Signed   By: Garald Balding M.D.   On: 01/05/2017 21:36   Dg Chest 2 View  Result Date: 01/05/2017 CLINICAL DATA:  Faint feelings, near syncopal EXAM: CHEST  2 VIEW COMPARISON:  None FINDINGS: Normal heart size, mediastinal contours, and pulmonary vascularity. Lungs clear. No pleural effusion or pneumothorax. Question subtle LEFT nipple shadow. Bones unremarkable. IMPRESSION: No acute abnormalities. **An incidental finding of potential clinical significance has been found. Question LEFT nipple shadow. Repeat PA chest  radiograph with nipple markers recommended to exclude pulmonary nodule.** Electronically Signed   By: Lavonia Dana M.D.   On: 01/05/2017 16:38   Ct Head Wo Contrast  Result Date: 01/05/2017 CLINICAL DATA:  Near syncope EXAM: CT HEAD  WITHOUT CONTRAST TECHNIQUE: Contiguous axial images were obtained from the base of the skull through the vertex without intravenous contrast. COMPARISON:  None. FINDINGS: Brain: There is no evidence for acute hemorrhage, hydrocephalus, mass lesion, or abnormal extra-axial fluid collection. No definite CT evidence for acute infarction. Vascular: No hyperdense vessel or unexpected calcification. Skull: Normal. Negative for fracture or focal lesion. Sinuses/Orbits: No acute finding. Other: None. IMPRESSION: Unremarkable noncontrast CT exam of the brain. Electronically Signed   By: Misty Stanley M.D.   On: 01/05/2017 18:10    Microbiology: No results found for this or any previous visit (from the past 240 hour(s)).   Labs: Basic Metabolic Panel:  Recent Labs Lab 01/05/17 1236 01/05/17 2134 01/06/17 0741  NA 137  --  140  K 3.7  --  4.1  CL 107  --  106  CO2 22  --  28  GLUCOSE 104*  --  102*  BUN 13  --  14  CREATININE 1.08 1.24 1.27*  CALCIUM 9.3  --  9.2   Liver Function Tests: No results for input(s): AST, ALT, ALKPHOS, BILITOT, PROT, ALBUMIN in the last 168 hours. No results for input(s): LIPASE, AMYLASE in the last 168 hours. No results for input(s): AMMONIA in the last 168 hours. CBC:  Recent Labs Lab 01/05/17 1236 01/05/17 2134 01/06/17 0741  WBC 5.9 6.1 4.6  HGB 14.2 14.6 14.4  HCT 39.7 41.5 40.8  MCV 91.5 91.8 91.7  PLT 268 267 266   Cardiac Enzymes:  Recent Labs Lab 01/05/17 2134 01/06/17 0217 01/06/17 0741  TROPONINI <0.03 <0.03 <0.03   BNP: BNP (last 3 results) No results for input(s): BNP in the last 8760 hours.  ProBNP (last 3 results) No results for input(s): PROBNP in the last 8760 hours.  CBG: No results for input(s): GLUCAP in the last 168 hours.     SignedCristal Ford  [facility] 01/06/2017, 1:34 PM

## 2017-01-06 NOTE — Progress Notes (Signed)
  Echocardiogram 2D Echocardiogram has been performed.  Tresa Res 01/06/2017, 11:30 AM

## 2017-01-06 NOTE — Plan of Care (Signed)
Problem: Pain Managment: Goal: General experience of comfort will improve Outcome: Progressing Patient has not complained of any pain thus far into the shift

## 2017-01-17 ENCOUNTER — Encounter: Payer: Self-pay | Admitting: Cardiology

## 2017-01-17 ENCOUNTER — Ambulatory Visit (INDEPENDENT_AMBULATORY_CARE_PROVIDER_SITE_OTHER): Payer: Self-pay | Admitting: Cardiology

## 2017-01-17 VITALS — BP 132/88 | HR 80 | Ht 66.0 in | Wt 163.0 lb

## 2017-01-17 DIAGNOSIS — R9431 Abnormal electrocardiogram [ECG] [EKG]: Secondary | ICD-10-CM

## 2017-01-17 DIAGNOSIS — I10 Essential (primary) hypertension: Secondary | ICD-10-CM

## 2017-01-17 DIAGNOSIS — R55 Syncope and collapse: Secondary | ICD-10-CM

## 2017-01-17 NOTE — Patient Instructions (Signed)
Medication Instructions:   NO CHANGE  Testing/Procedures:  Your physician has requested that you have a stress echocardiogram. For further information please visit [email or web address]. Please follow instruction sheet as given.   Your physician has recommended that you wear an event monitor. Event monitors are medical devices that record the heart's electrical activity. Doctors most often [place] these monitors to diagnose arrhythmias. Arrhythmias are problems with the speed or rhythm of the heartbeat. The monitor is a small, portable device. You can wear one while you do your normal daily activities. This is usually used to diagnose what is causing palpitations/syncope (passing out).    Follow-Up:  Your physician recommends that you schedule a follow-up appointment in: [place]

## 2017-01-17 NOTE — Progress Notes (Signed)
Referring-Joseph Mikhail, DO Reason for referral-Near syncope and abnormal ECG  HPI: 51 year old male for evaluation of near syncope and abnormal ECG at request of Cristal Ford, DO. Patient was admitted August 2018 with near syncopal episodes associated with headaches. Carotid Dopplers August 2018 showed 1-39% bilateral stenosis. Echocardiogram August 2018 showed vigorous LV systolic function, grade 1 diastolic dysfunction. Head CT unremarkable. Hemoglobin and troponins normal. TSH 3.412. Patient was also hypertensive on admission which improved with reinitiation of blood pressure medications. Over the past 1 month he has had episodes of near syncope. The first 2 episodes were associated with being hot and having headache. There was no nausea, vomiting, diaphoresis, palpitations, dyspnea or chest pain. His events last 3-5 minutes and improve with ice packs or cold on his neck. He has had lighter episodes more recently. He otherwise denies dyspnea on exertion, orthopnea, PND, pedal edema, exertional chest pain or history of syncope. Because of the above we were asked to evaluate.  Current Outpatient Prescriptions  Medication Sig Dispense Refill  . aspirin 325 MG tablet Take 325 mg by mouth daily as needed for mild pain or headache.     . losartan-hydrochlorothiazide (HYZAAR) 100-25 MG tablet Take 1 tablet by mouth daily. 30 tablet 0   No current facility-administered medications for this visit.     No Known Allergies   Past Medical History:  Diagnosis Date  . Hypertension     History reviewed. No pertinent surgical history.  Social History   Social History  . Marital status: Single    Spouse name: N/A  . Number of children: N/A  . Years of education: N/A   Occupational History  .      Kitchen   Social History Main Topics  . Smoking status: Never Smoker  . Smokeless tobacco: Never Used  . Alcohol use 0.0 oz/week     Comment: 16-20 beers per week  . Drug use: No  .  Sexual activity: Not on file   Other Topics Concern  . Not on file   Social History Narrative  . No narrative on file    Family History  Problem Relation Age of Onset  . Diabetes Mother   . Diabetes Maternal Grandmother   . Diabetes Maternal Grandfather   . Hypertension Maternal Grandfather   . Hyperlipidemia Maternal Grandfather   . Diabetes Paternal Grandmother   . Diabetes Paternal Grandfather   . Hypertension Paternal Grandfather     ROS: no fevers or chills, productive cough, hemoptysis, dysphasia, odynophagia, melena, hematochezia, dysuria, hematuria, rash, seizure activity, orthopnea, PND, pedal edema, claudication. Remaining systems are negative.  Physical Exam:   Blood pressure 132/88, pulse 80, height 5\' 6"  (1.676 m), weight 73.9 kg (163 lb).  General:  Well developed/well nourished in NAD Skin warm/dry Patient not depressed No peripheral clubbing Back-normal HEENT-normal/normal eyelids Neck supple/normal carotid upstroke bilaterally; no bruits; no JVD; no thyromegaly chest - CTA/ normal expansion CV - RRR/normal S1 and S2; no murmurs, rubs or gallops;  PMI nondisplaced Abdomen -NT/ND, no HSM, no mass, + bowel sounds, no bruit 2+ femoral pulses, no bruits Ext-no edema, chords, 2+ DP Neuro-grossly nonfocal  ECG - 01/06/2017-sinus rhythm, left ventricular hypertrophy with inferior lateral T-wave inversion. personally reviewed  A/P  1 near syncope-etiology unclear. Symptoms not clearly orthostatic mediated. They do not sound vagal in etiology. I will arrange an event monitor to further assess. Note his LV function is normal.  2 abnormal electrocardiogram-inferior lateral T-wave inversion noted on ECG. Plan  stress echocardiogram to rule out ischemia.   3 hypertension-blood pressure is controlled. Continue present medications.     Kirk Ruths, MD

## 2017-02-14 ENCOUNTER — Other Ambulatory Visit (HOSPITAL_COMMUNITY): Payer: Self-pay

## 2017-02-16 ENCOUNTER — Other Ambulatory Visit (HOSPITAL_COMMUNITY): Payer: Self-pay

## 2017-02-25 ENCOUNTER — Emergency Department (HOSPITAL_COMMUNITY)
Admission: EM | Admit: 2017-02-25 | Discharge: 2017-02-26 | Disposition: A | Discharge status: Home / Self Care | Payer: Self-pay | Attending: Emergency Medicine | Admitting: Emergency Medicine

## 2017-02-25 ENCOUNTER — Encounter (HOSPITAL_COMMUNITY): Payer: Self-pay | Admitting: *Deleted

## 2017-02-25 DIAGNOSIS — I10 Essential (primary) hypertension: Secondary | ICD-10-CM | POA: Insufficient documentation

## 2017-02-25 DIAGNOSIS — Z7982 Long term (current) use of aspirin: Secondary | ICD-10-CM | POA: Insufficient documentation

## 2017-02-25 DIAGNOSIS — R55 Syncope and collapse: Secondary | ICD-10-CM | POA: Insufficient documentation

## 2017-02-25 DIAGNOSIS — Z79899 Other long term (current) drug therapy: Secondary | ICD-10-CM | POA: Insufficient documentation

## 2017-02-25 LAB — CBC
HEMATOCRIT: 45.1 % (ref 39.0–52.0)
HEMOGLOBIN: 15.7 g/dL (ref 13.0–17.0)
MCH: 32.6 pg (ref 26.0–34.0)
MCHC: 34.8 g/dL (ref 30.0–36.0)
MCV: 93.6 fL (ref 78.0–100.0)
Platelets: 318 10*3/uL (ref 150–400)
RBC: 4.82 MIL/uL (ref 4.22–5.81)
RDW: 13.6 % (ref 11.5–15.5)
WBC: 6.8 10*3/uL (ref 4.0–10.5)

## 2017-02-25 LAB — BASIC METABOLIC PANEL
ANION GAP: 12 (ref 5–15)
BUN: 14 mg/dL (ref 6–20)
CHLORIDE: 101 mmol/L (ref 101–111)
CO2: 23 mmol/L (ref 22–32)
Calcium: 9.2 mg/dL (ref 8.9–10.3)
Creatinine, Ser: 1.26 mg/dL — ABNORMAL HIGH (ref 0.61–1.24)
GFR calc Af Amer: >60 mL/min (ref >60)
GLUCOSE: 118 mg/dL — AB (ref 65–99)
POTASSIUM: 3.9 mmol/L (ref 3.5–5.1)
Sodium: 136 mmol/L (ref 135–145)

## 2017-02-25 LAB — RAPID URINE DRUG SCREEN, HOSP PERFORMED
Amphetamines: NOT DETECTED
BENZODIAZEPINES: NOT DETECTED
Barbiturates: NOT DETECTED
COCAINE: NOT DETECTED
OPIATES: NOT DETECTED
TETRAHYDROCANNABINOL: NOT DETECTED

## 2017-02-25 LAB — URINALYSIS, ROUTINE W REFLEX MICROSCOPIC
Bilirubin Urine: NEGATIVE
GLUCOSE, UA: NEGATIVE mg/dL
Hgb urine dipstick: NEGATIVE
KETONES UR: NEGATIVE mg/dL
LEUKOCYTES UA: NEGATIVE
Nitrite: NEGATIVE
PH: 5 (ref 5.0–8.0)
Protein, ur: NEGATIVE mg/dL
SPECIFIC GRAVITY, URINE: 1.02 (ref 1.005–1.030)

## 2017-02-25 LAB — I-STAT TROPONIN, ED: Troponin i, poc: 0.01 ng/mL (ref 0.00–0.08)

## 2017-02-25 LAB — MAGNESIUM: Magnesium: 2 mg/dL (ref 1.7–2.4)

## 2017-02-25 MED ORDER — LABETALOL HCL 5 MG/ML IV SOLN
10.0000 mg | Freq: Once | INTRAVENOUS | Status: AC
  Administered 2017-02-25: 10 mg via INTRAVENOUS
  Filled 2017-02-25: qty 4

## 2017-02-25 MED ORDER — HYDRALAZINE HCL 20 MG/ML IJ SOLN
10.0000 mg | Freq: Once | INTRAMUSCULAR | Status: AC
  Administered 2017-02-25: 10 mg via INTRAVENOUS
  Filled 2017-02-25: qty 1

## 2017-02-25 MED ORDER — SODIUM CHLORIDE 0.9 % IV BOLUS (SEPSIS)
1000.0000 mL | Freq: Once | INTRAVENOUS | Status: AC
  Administered 2017-02-25: 1000 mL via INTRAVENOUS
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
## 2017-02-25 NOTE — ED Triage Notes (Addendum)
Patient states that he was out to dinner when he had a near syncopal episode. States he then felt his heart beat very fast. At this time denies chest pain or rapid heart rate. Did take BP meds this am. Patient states he feels like he can't stop shaking.   Patient states that this has happened intermittently at work and he will step in the cooler at work for a little and cool off. States he will get a headache first and then near syncope feeling.

## 2017-02-25 NOTE — Discharge Instructions (Signed)
Continue to take your anti-hypertensive medications as prescribed.  Call on Monday to schedule earlier follow-up with Dr. Stanford Breed.

## 2017-02-27 ENCOUNTER — Emergency Department (HOSPITAL_COMMUNITY): Payer: Self-pay

## 2017-02-27 ENCOUNTER — Encounter (HOSPITAL_COMMUNITY): Payer: Self-pay | Admitting: Emergency Medicine

## 2017-02-27 ENCOUNTER — Emergency Department (HOSPITAL_COMMUNITY)
Admission: EM | Admit: 2017-02-27 | Discharge: 2017-02-28 | Disposition: A | Discharge status: Home / Self Care | Payer: Self-pay | Attending: Emergency Medicine | Admitting: Emergency Medicine

## 2017-02-27 DIAGNOSIS — R0602 Shortness of breath: Secondary | ICD-10-CM | POA: Insufficient documentation

## 2017-02-27 DIAGNOSIS — Z79899 Other long term (current) drug therapy: Secondary | ICD-10-CM | POA: Insufficient documentation

## 2017-02-27 DIAGNOSIS — I10 Essential (primary) hypertension: Secondary | ICD-10-CM | POA: Insufficient documentation

## 2017-02-27 DIAGNOSIS — Z7982 Long term (current) use of aspirin: Secondary | ICD-10-CM | POA: Insufficient documentation

## 2017-02-27 DIAGNOSIS — R55 Syncope and collapse: Secondary | ICD-10-CM | POA: Insufficient documentation

## 2017-02-27 LAB — BASIC METABOLIC PANEL
Anion gap: 9 (ref 5–15)
BUN: 15 mg/dL (ref 6–20)
CHLORIDE: 103 mmol/L (ref 101–111)
CO2: 23 mmol/L (ref 22–32)
CREATININE: 1.2 mg/dL (ref 0.61–1.24)
Calcium: 8.8 mg/dL — ABNORMAL LOW (ref 8.9–10.3)
GFR calc Af Amer: >60 mL/min (ref >60)
GFR calc non Af Amer: >60 mL/min (ref >60)
Glucose, Bld: 111 mg/dL — ABNORMAL HIGH (ref 65–99)
Potassium: 3.5 mmol/L (ref 3.5–5.1)
Sodium: 135 mmol/L (ref 135–145)

## 2017-02-27 LAB — CBC
HCT: 39.7 % (ref 39.0–52.0)
Hemoglobin: 13.7 g/dL (ref 13.0–17.0)
MCH: 32.2 pg (ref 26.0–34.0)
MCHC: 34.5 g/dL (ref 30.0–36.0)
MCV: 93.2 fL (ref 78.0–100.0)
PLATELETS: 277 10*3/uL (ref 150–400)
RBC: 4.26 MIL/uL (ref 4.22–5.81)
RDW: 13.5 % (ref 11.5–15.5)
WBC: 6 10*3/uL (ref 4.0–10.5)

## 2017-02-27 LAB — I-STAT TROPONIN, ED
Troponin i, poc: 0.01 ng/mL (ref 0.00–0.08)
Troponin i, poc: 0.02 ng/mL (ref 0.00–0.08)
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
## 2017-02-27 NOTE — ED Provider Notes (Signed)
[place] DEPT Provider Note   CSN: [id number] Arrival date & time: 02/27/17  1648     History   Chief Complaint Chief Complaint  Patient presents with  . Near Syncope  . Shortness of Breath  . Chest Pain    HPI Joseph Adkins is a 51 y.o. male.  51 yo M with a chief complaint of shortness of breath. The patient was at work and was standing in the kitchen. He decided to make a phone call and when he went to do that he suddenly felt like he needed to pass out. Became short of breath. Denied any chest pain. Lasted for about 10 minutes or so.Since he's been here is completely asymptomatic. Denies lower extremity edema. Denies cough congestion or fever. Denies prior PE or DVT.  The patient has been seen here twice for the same. He actually was seen 2 days ago. The first visit he was admitted and had a complete syncope workup which was unremarkable. At that time it was thought to be vasovagal in nature.   The history is provided by the patient.  Illness  This is a new problem. The current episode started 3 to 5 hours ago. The problem occurs rarely. The problem has been resolved. Associated symptoms include shortness of breath. Pertinent negatives include no chest pain, no abdominal pain and no headaches. Nothing aggravates the symptoms. Nothing relieves the symptoms. He has tried nothing for the symptoms. The treatment provided no relief.    Past Medical History:  Diagnosis Date  . Hypertension     Patient Active Problem List   Diagnosis Date Noted  . Near syncope 01/05/2017    History reviewed. No pertinent surgical history.     Home Medications    Prior to Admission medications   Medication Sig Start Date End Date Taking? Authorizing Provider  aspirin 325 MG tablet Take 325 mg by mouth daily.    Yes [provider]  Garlic 4742 MG CAPS Take 1,000 mg by mouth daily.   Yes [provider]  losartan-hydrochlorothiazide (HYZAAR) 100-25 MG tablet  Take 1 tablet by mouth daily. 01/06/17  Yes Mikhail, Maryann, DO  magnesium gluconate (MAGONATE) 500 MG tablet Take 500 mg by mouth daily.   Yes [provider]    Family History Family History  Problem Relation Age of Onset  . Diabetes Mother   . Diabetes Maternal Grandmother   . Diabetes Maternal Grandfather   . Hypertension Maternal Grandfather   . Hyperlipidemia Maternal Grandfather   . Diabetes Paternal Grandmother   . Diabetes Paternal Grandfather   . Hypertension Paternal Grandfather     Social History Social History  Substance Use Topics  . Smoking status: Never Smoker  . Smokeless tobacco: Never Used  . Alcohol use 0.0 oz/week     Comment: 16-20 beers per week     Allergies   Patient has no known allergies.   Review of Systems Review of Systems  Constitutional: Negative for chills and fever.  HENT: Negative for congestion and facial swelling.   Eyes: Negative for discharge and visual disturbance.  Respiratory: Positive for shortness of breath.   Cardiovascular: Negative for chest pain and palpitations.  Gastrointestinal: Negative for abdominal pain, diarrhea and vomiting.  Musculoskeletal: Negative for arthralgias and myalgias.  Skin: Negative for color change and rash.  Neurological: Negative for tremors, syncope and headaches.  Psychiatric/Behavioral: Negative for confusion and dysphoric mood.     Physical Exam Updated Vital Signs BP (!) 200/127  Pulse 83   Temp 98.9 F (37.2 C) (Oral)   Resp 15   SpO2 98%   Physical Exam  Constitutional: He is oriented to person, place, and time. He appears well-developed and well-nourished.  HENT:  Head: Normocephalic and atraumatic.  Eyes: Pupils are equal, round, and reactive to light. EOM are normal.  Neck: Normal range of motion. Neck supple. No JVD present.  Cardiovascular: Normal rate and regular rhythm.  Exam reveals no gallop and no friction rub.   No murmur heard. Pulmonary/Chest: No  respiratory distress. He has no wheezes.  Abdominal: He exhibits no distension and no mass. There is no tenderness. There is no rebound and no guarding.  Musculoskeletal: Normal range of motion.  Neurological: He is alert and oriented to person, place, and time.  Skin: No rash noted. No pallor.  Psychiatric: He has a normal mood and affect. His behavior is normal.  Nursing note and vitals reviewed.    ED Treatments / Results  Labs (all labs ordered are listed, but only abnormal results are displayed) Labs Reviewed  BASIC METABOLIC PANEL - Abnormal; Notable for the following:       Result Value   Glucose, Bld 111 (*)    Calcium 8.8 (*)    All other components within normal limits  CBC  I-STAT TROPONIN, ED  I-STAT TROPONIN, ED    EKG  EKG Interpretation  Date/Time:  Monday February 27 2017 16:54:22 EDT Ventricular Rate:  104 PR Interval:  144 QRS Duration: 86 QT Interval:  350 QTC Calculation: 460 R Axis:   -22 Text Interpretation:  Sinus tachycardia Biatrial enlargement Left ventricular hypertrophy Abnormal ECG No significant change since last tracing Confirmed by Deno Etienne [phone]) on 02/27/2017 10:11:28 PM       Radiology Dg Chest 2 View  Result Date: 02/27/2017 CLINICAL DATA:  Syncope. EXAM: CHEST  2 VIEW COMPARISON:  Chest radiograph 01/05/2017 FINDINGS: Normal cardiac and mediastinal contours. No consolidative pulmonary opacities. No pleural effusion or pneumothorax. Regional skeleton is unremarkable. IMPRESSION: No acute cardiopulmonary process. Electronically Signed   By: Lovey Newcomer M.D.   On: 02/27/2017 17:47    Procedures Procedures (including critical care time)  Medications Ordered in ED Medications - No data to display   Initial Impression / Assessment and Plan / ED Course  I have reviewed the triage vital signs and the nursing notes.  Pertinent labs & imaging results that were available during my care of the patient were reviewed by me and considered  in my medical decision making (see chart for details).     51 yo M with a chief complaint of shortness of breath and a feeling that he would pass out. I doubt that this is a PE. His first troponin was negative. Will repeat. Chest x-ray labs otherwise unremarkable. EKG without any changes. Continues be asymptomatic for the past 5 hours.  Delta negative.  D/c home.   11:42 PM:  I have discussed the diagnosis/risks/treatment options with the patient and believe the pt to be eligible for discharge home to follow-up with PCP, Cards. We also discussed returning to the ED immediately if new or worsening sx occur. We discussed the sx which are most concerning (e.g., chest pain, sob, repeat event) that necessitate immediate return. Medications administered to the patient during their visit and any new prescriptions provided to the patient are listed below.  Medications given during this visit Medications - No data to display   The patient appears reasonably screen and/or  stabilized for discharge and I doubt any other medical condition or other [facility] requiring further screening, evaluation, or treatment in the ED at this time prior to discharge.    Final Clinical Impressions(s) / ED Diagnoses   Final diagnoses:  Shortness of breath  Near syncope    New Prescriptions New Prescriptions   No medications on file     Deno Etienne, DO 02/27/17 2342

## 2017-02-27 NOTE — Discharge Instructions (Signed)
Eat and drink well for the next couple of days.  Return for repeat episode.  Discuss with your cardiologist.

## 2017-02-27 NOTE — ED Triage Notes (Signed)
Pt to ER by private vehicle, for evaluation of near syncope after experiencing central chest discomfort and shortness of breath this afternoon. States episodes are intermittent. Denies pain at this time. VSS. A/o x4.

## 2017-02-27 NOTE — ED Notes (Signed)
Patient reports he is feeling fine. Denies SOB, denies lightheadedness. BP elevated at 184/122. Waiting for EDP.

## 2017-02-27 NOTE — ED Notes (Signed)
Spoke with patient about following up with his cardiologist.  Sts he can call his cardiologist in the morning to schedule an appointment.

## 2017-02-28 ENCOUNTER — Encounter: Payer: Self-pay | Admitting: Urgent Care

## 2017-02-28 ENCOUNTER — Ambulatory Visit (INDEPENDENT_AMBULATORY_CARE_PROVIDER_SITE_OTHER): Payer: Self-pay | Admitting: Urgent Care

## 2017-02-28 VITALS — BP 180/120 | HR 100 | Temp 98.7°F | Resp 18 | Ht 66.0 in | Wt 167.0 lb

## 2017-02-28 DIAGNOSIS — I10 Essential (primary) hypertension: Secondary | ICD-10-CM

## 2017-02-28 DIAGNOSIS — R030 Elevated blood-pressure reading, without diagnosis of hypertension: Secondary | ICD-10-CM

## 2017-02-28 MED ORDER — LOSARTAN POTASSIUM-HCTZ 100-25 MG PO TABS: 1.0000 | ORAL_TABLET | Freq: Every day | ORAL | 1 refills | Status: DC

## 2017-02-28 NOTE — Progress Notes (Signed)
  MRN: [id number] DOB: 1965-08-26  Subjective:   Joseph Adkins is a 52 y.o. male presenting for chief complaint of pre-syncope. Patient is supposed to be on losartan-HCTZ, has run out of his medication. He has been to the hospital, [facility], 3 times in the past 2 months. Patient is going to see Dr. Stanford Breed with cardiology tomorrow. Denies lightheadedness, dizziness, chronic headache, blurred vision, chest pain, shortness of breath, heart racing, palpitations, nausea, vomiting, abdominal pain, hematuria, lower leg swelling. Denies smoking cigarettes.  Joseph Adkins has a current medication list which includes the following prescription(s): losartan-hydrochlorothiazide. Also has No Known Allergies.  Joseph Adkins  has a past medical history of Hypertension. Also  has no past surgical history on file.  Objective:   Vitals: BP (!) 180/120   Pulse 100   Temp 98.7 F (37.1 C) (Oral)   Resp 18   Ht 5\' 6"  (1.676 m)   Wt 167 lb (75.8 kg)   SpO2 99%   BMI 26.95 kg/m   Physical Exam  Constitutional: He is oriented to person, place, and time. He appears well-developed and well-nourished.  HENT:  Mouth/Throat: Oropharynx is clear and moist.  Eyes: Pupils are equal, round, and reactive to light. EOM are normal. No scleral icterus.  Neck: Normal range of motion. Neck supple. No thyromegaly present.  Cardiovascular: Normal rate, regular rhythm and intact distal pulses.  Exam reveals no gallop and no friction rub.   No murmur heard. Pulmonary/Chest: No respiratory distress. He has no wheezes. He has no rales.  Abdominal: Soft. Bowel sounds are normal. He exhibits no distension and no mass. There is no tenderness. There is no guarding.  Musculoskeletal: He exhibits no edema.  Neurological: He is alert and oriented to person, place, and time. No cranial nerve deficit.  Skin: Skin is warm and dry.  Psychiatric: He has a normal mood and affect.   Results for orders placed or performed during the hospital  encounter of 02/27/17 (from the past 24 hour(s))  I-stat troponin, ED     Status: None   Collection Time: 02/27/17 10:49 PM  Result Value Ref Range   Troponin i, poc 0.02 0.00 - 0.08 ng/mL   Comment 3           Assessment and Plan :   1. Essential hypertension 2. Elevated blood pressure reading - Counseled on risks of uncontrolled HTN. Will have patient start los-HCTZ. He is to keep his appointment with cardiology. RTC in 2 days or 1 week depending on his cardiology visit. Emergency room and return-to-clinic precautions discussed, patient verbalized understanding.   Jaynee Eagles, PA-C [facility] [phone] 02/28/2017  5:55 PM

## 2017-02-28 NOTE — Patient Instructions (Addendum)
Hypertension Hypertension, commonly called high blood pressure, is when the force of blood pumping through the arteries is too strong. The arteries are the blood vessels that carry blood from the heart throughout the body. Hypertension forces the heart to work harder to pump blood and may cause arteries to become narrow or stiff. Having untreated or uncontrolled hypertension can cause heart attacks, strokes, kidney disease, and other problems. A blood pressure reading consists of a higher number over a lower number. Ideally, your blood pressure should be below 120/80. The first ("top") number is called the systolic pressure. It is a measure of the pressure in your arteries as your heart beats. The second ("bottom") number is called the diastolic pressure. It is a measure of the pressure in your arteries as the heart relaxes. What are the causes? The cause of this condition is not known. What increases the risk? Some risk factors for high blood pressure are under your control. Others are not. Factors you can change  Smoking.  Having type 2 diabetes mellitus, high cholesterol, or both.  Not getting enough exercise or physical activity.  Being overweight.  Having too much fat, sugar, calories, or salt (sodium) in your diet.  Drinking too much alcohol. Factors that are difficult or impossible to change  Having chronic kidney disease.  Having a family history of high blood pressure.  Age. Risk increases with age.  Race. You may be at higher risk if you are African-American.  Gender. Men are at higher risk than women before age 45. After age 65, women are at higher risk than men.  Having obstructive sleep apnea.  Stress. What are the signs or symptoms? Extremely high blood pressure (hypertensive crisis) may cause:  Headache.  Anxiety.  Shortness of breath.  Nosebleed.  Nausea and vomiting.  Severe chest pain.  Jerky movements you cannot control (seizures).  How is this  diagnosed? This condition is diagnosed by measuring your blood pressure while you are seated, with your arm resting on a surface. The cuff of the blood pressure monitor will be placed directly against the skin of your upper arm at the level of your heart. It should be measured at least twice using the same arm. Certain conditions can cause a difference in blood pressure between your right and left arms. Certain factors can cause blood pressure readings to be lower or higher than normal (elevated) for a short period of time:  When your blood pressure is higher when you are in a health care provider's office than when you are at home, this is called white coat hypertension. Most people with this condition do not need medicines.  When your blood pressure is higher at home than when you are in a health care provider's office, this is called masked hypertension. Most people with this condition may need medicines to control blood pressure.  If you have a high blood pressure reading during one visit or you have normal blood pressure with other risk factors:  You may be asked to return on a different day to have your blood pressure checked again.  You may be asked to monitor your blood pressure at home for 1 week or longer.  If you are diagnosed with hypertension, you may have other blood or imaging tests to help your health care provider understand your overall risk for other conditions. How is this treated? This condition is treated by making healthy lifestyle changes, such as eating healthy foods, exercising more, and reducing your alcohol intake. Your   health care provider may prescribe medicine if lifestyle changes are not enough to get your blood pressure under control, and if:  Your systolic blood pressure is above 130.  Your diastolic blood pressure is above 80.  Your personal target blood pressure may vary depending on your medical conditions, your age, and other factors. Follow these  instructions at home: Eating and drinking  Eat a diet that is high in fiber and potassium, and low in sodium, added sugar, and fat. An example eating plan is called the DASH (Dietary Approaches to Stop Hypertension) diet. To eat this way: ? Eat plenty of fresh fruits and vegetables. Try to fill half of your plate at each meal with fruits and vegetables. ? Eat whole grains, such as whole wheat pasta, brown rice, or whole grain bread. Fill about one quarter of your plate with whole grains. ? Eat or drink low-fat dairy products, such as skim milk or low-fat yogurt. ? Avoid fatty cuts of meat, processed or cured meats, and poultry with skin. Fill about one quarter of your plate with lean proteins, such as fish, chicken without skin, beans, eggs, and tofu. ? Avoid premade and processed foods. These tend to be higher in sodium, added sugar, and fat.  Reduce your daily sodium intake. Most people with hypertension should eat less than 1,500 mg of sodium a day.  Limit alcohol intake to no more than 1 drink a day for nonpregnant women and 2 drinks a day for men. One drink equals 12 oz of beer, 5 oz of wine, or 1 oz of hard liquor. Lifestyle  Work with your health care provider to maintain a healthy body weight or to lose weight. Ask what an ideal weight is for you.  Get at least 30 minutes of exercise that causes your heart to beat faster (aerobic exercise) most days of the week. Activities may include walking, swimming, or biking.  Include exercise to strengthen your muscles (resistance exercise), such as pilates or lifting weights, as part of your weekly exercise routine. Try to do these types of exercises for 30 minutes at least 3 days a week.  Do not use any products that contain nicotine or tobacco, such as cigarettes and e-cigarettes. If you need help quitting, ask your health care provider.  Monitor your blood pressure at home as told by your health care provider.  Keep all follow-up visits as  told by your health care provider. This is important. Medicines  Take over-the-counter and prescription medicines only as told by your health care provider. Follow directions carefully. Blood pressure medicines must be taken as prescribed.  Do not skip doses of blood pressure medicine. Doing this puts you at risk for problems and can make the medicine less effective.  Ask your health care provider about side effects or reactions to medicines that you should watch for. Contact a health care provider if:  You think you are having a reaction to a medicine you are taking.  You have headaches that keep coming back (recurring).  You feel dizzy.  You have swelling in your ankles.  You have trouble with your vision. Get help right away if:  You develop a severe headache or confusion.  You have unusual weakness or numbness.  You feel faint.  You have severe pain in your chest or abdomen.  You vomit repeatedly.  You have trouble breathing. Summary  Hypertension is when the force of blood pumping through your arteries is too strong. If this condition is not   controlled, it may put you at risk for serious complications.  Your personal target blood pressure may vary depending on your medical conditions, your age, and other factors. For most people, a normal blood pressure is less than 120/80.  Hypertension is treated with lifestyle changes, medicines, or a combination of both. Lifestyle changes include weight loss, eating a healthy, low-sodium diet, exercising more, and limiting alcohol. This information is not intended to replace advice given to you by your health care provider. Make sure you discuss any questions you have with your health care provider. Document Released: 05/16/2005 Document Revised: 04/13/2016 Document Reviewed: 04/13/2016 Elsevier Interactive Patient Education  2018 Elsevier Inc.     IF you received an x-ray today, you will receive an invoice from [facility]. Please contact  Radiology at [phone] with questions or concerns regarding your invoice.   IF you received labwork today, you will receive an invoice from [facility]. Please contact [facility] at [phone] with questions or concerns regarding your invoice.   Our billing staff will not be able to assist you with questions regarding bills from these companies.  You will be contacted with the lab results as soon as they are available. The fastest way to get your results is to activate your My Chart account. Instructions are located on the last page of this paperwork. If you have not heard from us regarding the results in 2 weeks, please contact this office.     

## 2017-03-01 ENCOUNTER — Encounter: Payer: Self-pay | Admitting: Physician Assistant

## 2017-03-01 ENCOUNTER — Ambulatory Visit (INDEPENDENT_AMBULATORY_CARE_PROVIDER_SITE_OTHER): Payer: Self-pay | Admitting: Physician Assistant

## 2017-03-01 VITALS — BP 190/120 | HR 88 | Ht 66.0 in | Wt 166.2 lb

## 2017-03-01 DIAGNOSIS — R55 Syncope and collapse: Secondary | ICD-10-CM

## 2017-03-01 DIAGNOSIS — R9431 Abnormal electrocardiogram [ECG] [EKG]: Secondary | ICD-10-CM

## 2017-03-01 DIAGNOSIS — R079 Chest pain, unspecified: Secondary | ICD-10-CM

## 2017-03-01 DIAGNOSIS — I10 Essential (primary) hypertension: Secondary | ICD-10-CM

## 2017-03-01 NOTE — Patient Instructions (Signed)
Medication Instructions:  NO CHANGES If you need a refill on your cardiac medications before your next appointment, please call your pharmacy.  Testing/Procedures: TRY TO SCHEDULE ECHO STRESS TEST ON THE SAME DAY AS SCHEDULED EVENT MONITOR  Follow-Up: Your physician wants you to follow-up: CHANGE SCHEDULED [facility] APPT FROM 03-20-17 TO 6 WEEKS AFTER MONITOR ~04-25-2017.   Special Instructions: WILL TALK WITH [facility] ABOUT MEDICATIONS AT THE PHARMACY  Thank you for choosing [facility] at [facility]!!
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
## 2017-03-02 ENCOUNTER — Ambulatory Visit: Payer: Self-pay | Admitting: Urgent Care

## 2017-03-14 ENCOUNTER — Ambulatory Visit (INDEPENDENT_AMBULATORY_CARE_PROVIDER_SITE_OTHER): Payer: Self-pay

## 2017-03-14 DIAGNOSIS — R9431 Abnormal electrocardiogram [ECG] [EKG]: Secondary | ICD-10-CM

## 2017-03-14 DIAGNOSIS — R55 Syncope and collapse: Secondary | ICD-10-CM

## 2017-03-20 ENCOUNTER — Ambulatory Visit: Payer: Self-pay | Admitting: Cardiology

## 2017-03-23 ENCOUNTER — Telehealth (HOSPITAL_COMMUNITY): Payer: Self-pay | Admitting: *Deleted

## 2017-03-23 NOTE — Telephone Encounter (Signed)
Left message on voicemail in reference to upcoming appointment scheduled for 03/27/17. Phone number given for a call back so details instructions can be given. Joseph Adkins

## 2017-03-27 ENCOUNTER — Other Ambulatory Visit (HOSPITAL_COMMUNITY): Payer: Self-pay

## 2017-03-27 ENCOUNTER — Telehealth (HOSPITAL_COMMUNITY): Payer: Self-pay | Admitting: Physician Assistant

## 2017-03-27 ENCOUNTER — Ambulatory Visit (HOSPITAL_COMMUNITY): Payer: Self-pay

## 2017-03-29 NOTE — Telephone Encounter (Signed)
User: Cherie Dark A Date/time: 03/27/17 10:04 AM  Comment: Called pt and lmsg for him to CB to r/s stress echo that was missed today, 03/27/17..  Context:  Outcome: Left Message  Phone number: [phone] Phone Type: Home Phone  Comm. type: Telephone Call type: Outgoing  Contact: Prudy Feeler A Relation to patient: Self

## 2017-04-12 NOTE — Progress Notes (Deleted)
      HPI: FU near syncope and abnormal ECG. Patient was admitted August 2018 with near syncopal episodes associated with headaches. Carotid Dopplers August 2018 showed 1-39% bilateral stenosis. Echocardiogram August 2018 showed vigorous LV systolic function, grade 1 diastolic dysfunction. Head CT unremarkable. Hemoglobin and troponins normal. TSH 3.412. Patient was also hypertensive on admission which improved with reinitiation of blood pressure medications. Monitor October 2018 showed . Patient seen in the emergency room September and October with near syncope. Stress echocardiogram ordered but not performed. Patient was instructed previously to increase fluid intake. Since last seen,   Current Outpatient Medications  Medication Sig Dispense Refill  . aspirin 325 MG tablet Take 325 mg by mouth daily.    . Garlic 8841 MG CAPS Take 1 capsule by mouth daily.    Marland Kitchen losartan-hydrochlorothiazide (HYZAAR) 100-25 MG tablet Take 1 tablet by mouth daily. 90 tablet 1  . Magnesium 500 MG CAPS Take 1 capsule by mouth daily.     No current facility-administered medications for this visit.      Past Medical History:  Diagnosis Date  . Hypertension     No past surgical history on file.  Social History   Socioeconomic History  . Marital status: Single    Spouse name: Not on file  . Number of children: Not on file  . Years of education: Not on file  . Highest education level: Not on file  Social Needs  . Financial resource strain: Not on file  . Food insecurity - worry: Not on file  . Food insecurity - inability: Not on file  . Transportation needs - medical: Not on file  . Transportation needs - non-medical: Not on file  Occupational History  . Occupation: Teacher, adult education: [employer]  Tobacco Use  . Smoking status: Never Smoker  . Smokeless tobacco: Never Used  Substance and Sexual Activity  . Alcohol use: Yes    Alcohol/week: 0.0 oz    Comment: 16-20 beers per week    . Drug use: No  . Sexual activity: Not on file  Other Topics Concern  . Not on file  Social History Narrative  . Not on file    Family History  Problem Relation Age of Onset  . Diabetes Mother   . Diabetes Maternal Grandmother   . Diabetes Maternal Grandfather   . Hypertension Maternal Grandfather   . Hyperlipidemia Maternal Grandfather   . Diabetes Paternal Grandmother   . Diabetes Paternal Grandfather   . Hypertension Paternal Grandfather     ROS: no fevers or chills, productive cough, hemoptysis, dysphasia, odynophagia, melena, hematochezia, dysuria, hematuria, rash, seizure activity, orthopnea, PND, pedal edema, claudication. Remaining systems are negative.  Physical Exam: Well-developed well-nourished in no acute distress.  Skin is warm and dry.  HEENT is normal.  Neck is supple.  Chest is clear to auscultation with normal expansion.  Cardiovascular exam is regular rate and rhythm.  Abdominal exam nontender or distended. No masses palpated. Extremities show no edema. neuro grossly intact  ECG- personally reviewed  A/P  1  Kirk Ruths, MD

## 2017-04-13 ENCOUNTER — Telehealth (HOSPITAL_COMMUNITY): Payer: Self-pay | Admitting: *Deleted

## 2017-04-13 NOTE — Telephone Encounter (Signed)
Left message on voicemail in reference to upcoming appointment scheduled for 04/17/17. Phone number given for a call back so details instructions can be given. Joseph Adkins

## 2017-04-13 NOTE — Telephone Encounter (Signed)
Patient given detailed instructions per Stress Test Requisition Sheet for test on 04/17/17 at 7:30.Patient Notified to arrive 30 minutes early, and that it is imperative to arrive on time for appointment to keep from having the test rescheduled.  Patient verbalized understanding. Veronia Beets

## 2017-04-17 ENCOUNTER — Telehealth (HOSPITAL_COMMUNITY): Payer: Self-pay | Admitting: Cardiology

## 2017-04-17 ENCOUNTER — Ambulatory Visit (HOSPITAL_COMMUNITY): Payer: Self-pay

## 2017-04-17 ENCOUNTER — Encounter (INDEPENDENT_AMBULATORY_CARE_PROVIDER_SITE_OTHER): Payer: Self-pay

## 2017-04-17 ENCOUNTER — Ambulatory Visit (HOSPITAL_COMMUNITY): Payer: Self-pay | Attending: Cardiology

## 2017-04-17 ENCOUNTER — Telehealth (HOSPITAL_COMMUNITY): Payer: Self-pay | Admitting: *Deleted

## 2017-04-17 DIAGNOSIS — R9431 Abnormal electrocardiogram [ECG] [EKG]: Secondary | ICD-10-CM

## 2017-04-17 DIAGNOSIS — R55 Syncope and collapse: Secondary | ICD-10-CM

## 2017-04-17 NOTE — Telephone Encounter (Signed)
Patient arrived today for stress echo but blood pressure was 197/122 sitting and 171/121 standing. Took it manually, 181/120. Spoke ton Dr Caryl Comes and he recommended Clonidine HCL 0.1mg . Patient sitting awaiting retake of blood pressure in 30 minutes. Patient advised to await phone call from Dr. To discuss blood pressure and possible rescheduling of exam.

## 2017-04-17 NOTE — Telephone Encounter (Signed)
Took patient's blood pressure after the 0.1mg  Clonidine Hcl at 169/96.  Discussed with Dr. Caryl Comes and he said to give him one more 0.1 Clonidine Hcl and check again.  Second reading was 142/90.  Patient ok to go home.  Awaiting call from Dr. Stanford Breed.

## 2017-04-18 ENCOUNTER — Telehealth: Payer: Self-pay | Admitting: *Deleted

## 2017-04-18 NOTE — Telephone Encounter (Addendum)
Left message for pt to call   -- Message from Lelon Perla, MD sent at 04/17/2017  8:32 AM EST ---  Regarding: FW: Stress echo Would see how pts BP is doing at home and we can adjust based on readings; fu paov; once BP controlled arrange lexiscan nuclear study on meds Kirk Ruths  ----- Message ----- From: Elyse Jarvis Sent: 04/17/2017   8:05 AM To: Lelon Perla, MD Subject: Stress echo                                    Patient arrived today for stress echo but blood pressure was 197/122 sitting and 171/121 standing. Took it manually, 181/120. Spoke ton Dr Caryl Comes and he recommended Clonidine HCL 0.1mg . Patient sitting awaiting retake of blood pressure in 30 minutes. Patient advised to await phone call from Dr. To discuss blood pressure and possible rescheduling of exam.

## 2017-04-19 NOTE — Telephone Encounter (Signed)
Spoke with pt, his bp this morning was 140/92 after meds. He is going to track his bp over the weekend and let me know how it is running so we can decide if medication changes need to be made.

## 2017-04-19 NOTE — Telephone Encounter (Signed)
[id number] 

## 2017-04-19 NOTE — Telephone Encounter (Signed)
Joseph Adkins is returning a call . Thanks

## 2017-04-25 ENCOUNTER — Ambulatory Visit: Payer: Self-pay | Admitting: Cardiology

## 2017-04-27 ENCOUNTER — Other Ambulatory Visit: Payer: Self-pay

## 2017-04-27 ENCOUNTER — Encounter (HOSPITAL_COMMUNITY): Payer: Self-pay

## 2017-04-27 ENCOUNTER — Ambulatory Visit: Payer: Self-pay | Admitting: Family Medicine

## 2017-04-27 ENCOUNTER — Emergency Department (HOSPITAL_COMMUNITY)
Admission: EM | Admit: 2017-04-27 | Discharge: 2017-04-27 | Disposition: A | Discharge status: Home / Self Care | Payer: Self-pay | Attending: Emergency Medicine | Admitting: Emergency Medicine

## 2017-04-27 ENCOUNTER — Emergency Department (HOSPITAL_COMMUNITY): Payer: Self-pay

## 2017-04-27 DIAGNOSIS — Z79899 Other long term (current) drug therapy: Secondary | ICD-10-CM | POA: Insufficient documentation

## 2017-04-27 DIAGNOSIS — I10 Essential (primary) hypertension: Secondary | ICD-10-CM | POA: Insufficient documentation

## 2017-04-27 DIAGNOSIS — R42 Dizziness and giddiness: Secondary | ICD-10-CM | POA: Insufficient documentation

## 2017-04-27 DIAGNOSIS — Z7982 Long term (current) use of aspirin: Secondary | ICD-10-CM | POA: Insufficient documentation

## 2017-04-27 DIAGNOSIS — R0789 Other chest pain: Secondary | ICD-10-CM | POA: Insufficient documentation

## 2017-04-27 LAB — CBC
HEMATOCRIT: 44.8 % (ref 39.0–52.0)
Hemoglobin: 15.8 g/dL (ref 13.0–17.0)
MCH: 32.8 pg (ref 26.0–34.0)
MCHC: 35.3 g/dL (ref 30.0–36.0)
MCV: 93.1 fL (ref 78.0–100.0)
Platelets: 297 10*3/uL (ref 150–400)
RBC: 4.81 MIL/uL (ref 4.22–5.81)
RDW: 13 % (ref 11.5–15.5)
WBC: 7.2 10*3/uL (ref 4.0–10.5)

## 2017-04-27 LAB — BASIC METABOLIC PANEL
Anion gap: 8 (ref 5–15)
BUN: 16 mg/dL (ref 6–20)
CHLORIDE: 104 mmol/L (ref 101–111)
CO2: 25 mmol/L (ref 22–32)
Calcium: 9.6 mg/dL (ref 8.9–10.3)
Creatinine, Ser: 1.26 mg/dL — ABNORMAL HIGH (ref 0.61–1.24)
GFR calc Af Amer: >60 mL/min (ref >60)
GFR calc non Af Amer: >60 mL/min (ref >60)
GLUCOSE: 108 mg/dL — AB (ref 65–99)
POTASSIUM: 4.2 mmol/L (ref 3.5–5.1)
Sodium: 137 mmol/L (ref 135–145)

## 2017-04-27 LAB — I-STAT TROPONIN, ED
Troponin i, poc: 0 ng/mL (ref 0.00–0.08)
Troponin i, poc: 0 ng/mL (ref 0.00–0.08)

## 2017-04-27 MED ORDER — HYDROCHLOROTHIAZIDE 25 MG PO TABS
25.0000 mg | ORAL_TABLET | Freq: Once | ORAL | Status: AC
  Administered 2017-04-27: 25 mg via ORAL
  Filled 2017-04-27: qty 1

## 2017-04-27 MED ORDER — LOSARTAN POTASSIUM-HCTZ 100-25 MG PO TABS: 1.0000 | ORAL_TABLET | Freq: Every day | ORAL | 0 refills | Status: DC

## 2017-04-27 MED ORDER — LOSARTAN POTASSIUM 50 MG PO TABS
100.0000 mg | ORAL_TABLET | Freq: Once | ORAL | Status: DC
  Filled 2017-04-27: qty 2

## 2017-04-27 NOTE — ED Notes (Signed)
Pt states he will get additional Bp meds that were planned to be given here at pharmacy so he may be discharged now. PA notified. Patient verbalizes understanding of discharge instructions. Opportunity for questioning and answers were provided.

## 2017-04-27 NOTE — Discharge Instructions (Signed)
Take your home medications as prescribed.  Follow-up with your cardiologist on Tuesday as scheduled.  Return to the ED immediately for any concerning signs or develop such as chest pain, shortness of breath, pain with taking deep breaths, coughing up blood, fevers, or passing out.

## 2017-04-27 NOTE — ED Provider Notes (Signed)
[facility] Provider Note   CSN: [id number] Arrival date & time: 04/27/17  1347     History   Chief Complaint Chief Complaint  Patient presents with  . Chest Pain    HPI Joseph Adkins is a 51 y.o. male with history of hypertension presents today with chief complaint acute onset, progressively resolved chest pain. Patient states he was at work this morning at rest when he experienced sharp stabbing chest pain in the left side of his chest.  He states pain  lasted approximately 5 seconds and then resolved and has not returned.  Pain did not radiate.  It was associated with some lightheadedness but denies nausea, diaphoresis, or syncope.  He denies shortness of breath.  He is a non-smoker.  States he drinks approximately 14 beers weekly.  Denies IV drug use but states he occasionally smokes marijuana.  Denies orthopnea, PND, leg swelling.  No recent travel or surgeries, denies hemoptysis, testosterone hormone replacement therapy, history of cancer, or prior history of DVT or PE.  He has follow-up scheduled with his cardiologist on Tuesday.  He has not taken his blood pressure medication today.   The history is provided by the patient.    Past Medical History:  Diagnosis Date  . Hypertension     Patient Active Problem List   Diagnosis Date Noted  . Near syncope 01/05/2017    History reviewed. No pertinent surgical history.     Home Medications    Prior to Admission medications   Medication Sig Start Date End Date Taking? Authorizing Provider  aspirin 325 MG tablet Take 325 mg by mouth daily.   Yes [provider]  Garlic 1660 MG CAPS Take 1 capsule by mouth daily.   Yes [provider]  Magnesium 500 MG CAPS Take 1 capsule by mouth daily.   Yes [provider]  losartan-hydrochlorothiazide (HYZAAR) 100-25 MG tablet Take 1 tablet by mouth daily for 7 days. 04/27/17 05/04/17  Renita Papa, PA-C    Family  History Family History  Problem Relation Age of Onset  . Diabetes Mother   . Diabetes Maternal Grandmother   . Diabetes Maternal Grandfather   . Hypertension Maternal Grandfather   . Hyperlipidemia Maternal Grandfather   . Diabetes Paternal Grandmother   . Diabetes Paternal Grandfather   . Hypertension Paternal Grandfather     Social History Social History   Tobacco Use  . Smoking status: Never Smoker  . Smokeless tobacco: Never Used  Substance Use Topics  . Alcohol use: Yes    Alcohol/week: 0.0 oz    Comment: 16-20 beers per week  . Drug use: No     Allergies   Patient has no known allergies.   Review of Systems Review of Systems  Constitutional: Negative for chills, fatigue and fever.  Respiratory: Negative for shortness of breath.   Cardiovascular: Positive for chest pain. Negative for palpitations and leg swelling.  Gastrointestinal: Negative for abdominal pain, nausea and vomiting.  Neurological: Positive for light-headedness. Negative for syncope, weakness and headaches.  All other systems reviewed and are negative.    Physical Exam Updated Vital Signs BP (!) 166/106   Pulse 83   Temp 98.9 F (37.2 C) (Oral)   Resp 20   SpO2 100%   Physical Exam  Constitutional: He appears well-developed and well-nourished. No distress.  HENT:  Head: Normocephalic and atraumatic.  Eyes: Conjunctivae and EOM are normal. Pupils are equal, round, and reactive to light.  Right eye exhibits no discharge. Left eye exhibits no discharge.  Neck: Normal range of motion. Neck supple. No JVD present. No tracheal deviation present.  Cardiovascular: Normal rate, regular rhythm, intact distal pulses and normal pulses. Exam reveals no gallop, no S3, no S4, no distant heart sounds and no friction rub.  No murmur heard. Pulses:      Carotid pulses are 2+ on the right side, and 2+ on the left side.      Radial pulses are 2+ on the right side, and 2+ on the left side.       Dorsalis  pedis pulses are 2+ on the right side, and 2+ on the left side.       Posterior tibial pulses are 2+ on the right side, and 2+ on the left side.  Pulmonary/Chest: Effort normal. No accessory muscle usage. No respiratory distress.  Equal rise and fall of chest, no increased work of breathing, chest is nontender to palpation  Abdominal: Soft. Bowel sounds are normal. He exhibits no distension. There is no tenderness.  Musculoskeletal: He exhibits no edema.       Right lower leg: Normal. He exhibits no tenderness and no edema.       Left lower leg: Normal. He exhibits no tenderness and no edema.  Neurological: He is alert.  Skin: Skin is warm and dry. No erythema.  Psychiatric: He has a normal mood and affect. His behavior is normal.  Nursing note and vitals reviewed.    ED Treatments / Results  Labs (all labs ordered are listed, but only abnormal results are displayed) Labs Reviewed  BASIC METABOLIC PANEL - Abnormal; Notable for the following components:      Result Value   Glucose, Bld 108 (*)    Creatinine, Ser 1.26 (*)    All other components within normal limits  CBC  I-STAT TROPONIN, ED  I-STAT TROPONIN, ED    EKG  EKG Interpretation  Date/Time:  Thursday April 27 2017 13:54:51 EST Ventricular Rate:  116 PR Interval:  132 QRS Duration: 82 QT Interval:  316 QTC Calculation: 439 R Axis:   -7 Text Interpretation:  Sinus tachycardia Biatrial enlargement ST & T wave abnormality, consider lateral ischemia Abnormal ECG ST/T changes similar to Oct 2018 Confirmed by Sherwood Gambler [phone]) on 04/27/2017 5:59:51 PM       Radiology Dg Chest 2 View  Result Date: 04/27/2017 CLINICAL DATA:  Left-sided anterior chest pain beginning this morning. EXAM: CHEST  2 VIEW COMPARISON:  02/27/2017 FINDINGS: The heart size and mediastinal contours are within normal limits. Both lungs are clear. The visualized skeletal structures are unremarkable. IMPRESSION: No active cardiopulmonary  disease. Electronically Signed   By: Earle Gell M.D.   On: 04/27/2017 14:40    Procedures Procedures (including critical care time)  Medications Ordered in ED Medications  losartan (COZAAR) tablet 100 mg (100 mg Oral Refused 04/27/17 2010)  hydrochlorothiazide (HYDRODIURIL) tablet 25 mg (25 mg Oral Given 04/27/17 1925)     Initial Impression / Assessment and Plan / ED Course  I have reviewed the triage vital signs and the nursing notes.  Pertinent labs & imaging results that were available during my care of the patient were reviewed by me and considered in my medical decision making (see chart for details).     Patient with acute onset of left-sided chest pain which lasted a proximally 5 seconds before resolving earlier today.  No recurrence in pain.  He is afebrile, initially tachycardic with  resolution while in the ED.  Initially hypertensive but he has not taken his blood pressure medication.  On reevaluation blood pressure return to patient's baseline.  Serial troponins are negative.  Remainder of lab work is unremarkable.  EKG shows no significant changes from last, no evidence of ST segment abnormality or arrhythmia per low suspicion of ACS or MI.  Chest x-ray shows no acute cardiopulmonary abnormality.  No evidence of pneumonia, pleural effusion, pericarditis, aortic dissection, or myocarditis. Although tachycardic initially, I have such a low suspicion of PE that d-dimer is not indicated. He is nontoxic in appearance. Received home BP medication while in the ED. He has cardiology follow up scheduled in 4 days. Will refill home BP meds.  No further emergent workup required at this time.  He is stable for discharge home with follow-up with cardiology as scheduled outpatient.  Discussed indications for return to the ED. Pt verbalized understanding of and agreement with plan and is safe for discharge home at this time.  He is hemodynamically stable and has no complaints prior to  discharge.  Final Clinical Impressions(s) / ED Diagnoses   Final diagnoses:  Atypical chest pain    ED Discharge Orders        Ordered    losartan-hydrochlorothiazide (HYZAAR) 100-25 MG tablet  Daily     04/27/17 1955      Debroah Baller 04/27/17 2016  Sherwood Gambler, MD 04/27/17 [phone]

## 2017-04-27 NOTE — ED Triage Notes (Signed)
Patient complains of left anterior CP that he describes as sharp that just lasted a few seconds this am. Reports pain has resolved, no associated symptoms. Alert and oriented, NAD

## 2017-05-02 ENCOUNTER — Encounter: Payer: Self-pay | Admitting: Physician Assistant

## 2017-05-02 ENCOUNTER — Ambulatory Visit (INDEPENDENT_AMBULATORY_CARE_PROVIDER_SITE_OTHER): Payer: Self-pay | Admitting: Physician Assistant

## 2017-05-02 VITALS — BP 151/103 | HR 98 | Ht 66.0 in | Wt 167.0 lb

## 2017-05-02 DIAGNOSIS — Z79899 Other long term (current) drug therapy: Secondary | ICD-10-CM

## 2017-05-02 DIAGNOSIS — R9431 Abnormal electrocardiogram [ECG] [EKG]: Secondary | ICD-10-CM

## 2017-05-02 DIAGNOSIS — R55 Syncope and collapse: Secondary | ICD-10-CM

## 2017-05-02 DIAGNOSIS — R0789 Other chest pain: Secondary | ICD-10-CM

## 2017-05-02 DIAGNOSIS — I10 Essential (primary) hypertension: Secondary | ICD-10-CM

## 2017-05-02 MED ORDER — LOSARTAN POTASSIUM 100 MG PO TABS: 100.0000 mg | ORAL_TABLET | Freq: Every day | ORAL | 1 refills | Status: DC

## 2017-05-02 MED ORDER — ATENOLOL 25 MG PO TABS: 25.0000 mg | ORAL_TABLET | Freq: Every day | ORAL | 1 refills | Status: DC

## 2017-05-02 NOTE — Patient Instructions (Addendum)
Medication Instructions:   STOP Losartan-HCTZ  START Losartan 100mg  daily  START Atenolol 25mg  daily  Labwork:   Return for bloodwork (BMET) in 1-2 weeks. You may come to our office to have this done. It is not a fasting test.  Testing/Procedures:  none  Follow-Up:  With Almyra Deforest PA in 2-3 weeks.  If you need a refill on your cardiac medications before your next appointment, please call your pharmacy.

## 2017-05-02 NOTE — Progress Notes (Signed)
Cardiology Office Note    Date:  05/04/2017   ID:  PANCHO RUSHING, DOB 08-24-1965, MRN [id number]  PCP:  Patient, No Pcp Per  Cardiologist:  Dr. Stanford Breed  Chief Complaint  Patient presents with  . Follow-up    seen for Dr. Stanford Breed.     History of Present Illness:  NAGI FURIO is a 51 y.o. male with PMH of near-syncope and abnormal EKG. he was seen by Dr. Stanford Breed on 01/17/2017, event monitor and a stress echo was ordered.  He was seen in the ED for near syncope in September, creatinine was mildly elevated and he was tachycardic.  Tachycardia improved after hydration.  He was seen again on 02/27/2017 was near syncope and shortness of breath.  Heart monitor obtained on 03/14/2017 showed no significant arrhythmia.  Outpatient stress echocardiogram was ordered for atypical chest pain, however this was unable to be completed due to elevated blood pressure.  He was seen in the ED on 04/27/2017 for chest pain, he described chest pain lasting less than 5 seconds and resolve spontaneously.  Troponin was negative x2.  Patient presents today for cardiology office visit.  He has been doing well lately.  He denies any further chest pain or shortness of breath.  His blood pressure remain elevated today.  He says he did ran out of losartan-HCTZ last Wednesday, he was given another week's prescription by ED physician.  He has not had any chest discomfort since.  Given the previous possible dehydration, I have discontinued his hydrochlorothiazide and maintained him on losartan 100 mg daily.  Looking back, even on the heart monitor, he had frequent sinus tachycardia with heart rate in the low 100 range.  I wonder if this is related to hypovolemia.  As for his blood pressure, I will also added atenolol 25 mg daily.  This will likely improve his heart rate and blood pressure.  I will bring him back in 2 weeks for reassessment, if his blood pressure has improved, I plan to order a [employer].   Past  Medical History:  Diagnosis Date  . Hypertension     History reviewed. No pertinent surgical history.  Current Medications: Outpatient Medications Prior to Visit  Medication Sig Dispense Refill  . aspirin 325 MG tablet Take 325 mg by mouth daily.    . Garlic 8242 MG CAPS Take 1 capsule by mouth daily.    . Magnesium 500 MG CAPS Take 1 capsule by mouth daily.    Marland Kitchen losartan-hydrochlorothiazide (HYZAAR) 100-25 MG tablet Take 1 tablet by mouth daily for 7 days. 7 tablet 0   No facility-administered medications prior to visit.      Allergies:   Patient has no known allergies.   Social History   Socioeconomic History  . Marital status: Single    Spouse name: None  . Number of children: None  . Years of education: None  . Highest education level: None  Social Needs  . Financial resource strain: None  . Food insecurity - worry: None  . Food insecurity - inability: None  . Transportation needs - medical: None  . Transportation needs - non-medical: None  Occupational History  . Occupation: Teacher, adult education: [employer]  Tobacco Use  . Smoking status: Never Smoker  . Smokeless tobacco: Never Used  Substance and Sexual Activity  . Alcohol use: Yes    Alcohol/week: 0.0 oz    Comment: 16-20 beers per week  . Drug use: No  .  Sexual activity: None  Other Topics Concern  . None  Social History Narrative  . None     Family History:  The patient's family history includes Diabetes in his maternal grandfather, maternal grandmother, mother, paternal grandfather, and paternal grandmother; Hyperlipidemia in his maternal grandfather; Hypertension in his maternal grandfather and paternal grandfather.   ROS:   Please see the history of present illness.    ROS All other systems reviewed and are negative.   PHYSICAL EXAM:   VS:  BP (!) 151/103   Pulse 98   Ht 5\' 6"  (1.676 m)   Wt 167 lb (75.8 kg)   BMI 26.95 kg/m    GEN: Well nourished, well developed, in no  acute distress  HEENT: normal  Neck: no JVD, carotid bruits, or masses Cardiac: RRR; no murmurs, rubs, or gallops,no edema  Respiratory:  clear to auscultation bilaterally, normal work of breathing GI: soft, nontender, nondistended, + BS MS: no deformity or atrophy  Skin: warm and dry, no rash Neuro:  Alert and Oriented x 3, Strength and sensation are intact Psych: euthymic mood, full affect  Wt Readings from Last 3 Encounters:  05/02/17 167 lb (75.8 kg)  03/01/17 166 lb 3.2 oz (75.4 kg)  02/28/17 167 lb (75.8 kg)      Studies/Labs Reviewed:   EKG:  EKG is not ordered today.    Recent Labs: 01/05/2017: TSH 3.412 02/25/2017: Magnesium 2.0 04/27/2017: BUN 16; Creatinine, Ser 1.26; Hemoglobin 15.8; Platelets 297; Potassium 4.2; Sodium 137   Lipid Panel    Component Value Date/Time   CHOL 204 (H) 06/23/2015 0942   TRIG 47 06/23/2015 0942   HDL 76 06/23/2015 0942   CHOLHDL 2.7 06/23/2015 0942   VLDL 9 06/23/2015 0942   LDLCALC 119 06/23/2015 0942    Additional studies/ records that were reviewed today include:   Echo 01/06/2017 LV EF: 65% -   70% Study Conclusions  - Left ventricle: The cavity size was normal. There was moderate   concentric hypertrophy. Systolic function was vigorous. The   estimated ejection fraction was in the range of 65% to 70%.   Doppler parameters are consistent with abnormal left ventricular   relaxation (grade 1 diastolic dysfunction). There was no evidence   of elevated ventricular filling pressure by Doppler parameters. - Aortic valve: There was no regurgitation. - Mitral valve: There was trivial regurgitation. - Right atrium: The atrium was normal in size. - Tricuspid valve: There was trivial regurgitation. - Pulmonic valve: There was trivial regurgitation. - Pulmonary arteries: Systolic pressure was within the normal   range. - Pericardium, extracardiac: The pericardium was normal in   appearance.    ASSESSMENT:    1. Pre-syncope    2. Encounter for long-term (current) use of medications   3. Atypical chest pain   4. Abnormal EKG   5. Essential hypertension      PLAN:  In order of problems listed above:  1. Pre-syncope: Discontinue hydrochlorothiazide.  Recent heart monitor showed occasional tachycardia, however no significant arrhythmia.  2.  Abnormal EKG: Very atypical chest discomfort, plan to obtain Lexiscan Myoview once blood pressure under better control  3.  Hypertension: Discontinue hydrochlorothiazide, and losartan 100 mg daily.  Also add atenolol 25 mg daily.  Obtain basic metabolic panel in 1-2 weeks.    Medication Adjustments/Labs and Tests Ordered: Current medicines are reviewed at length with the patient today.  Concerns regarding medicines are outlined above.  Medication changes, Labs and Tests ordered today are listed  in the Patient Instructions below. Patient Instructions  Medication Instructions:   STOP Losartan-HCTZ  START Losartan 100mg  daily  START Atenolol 25mg  daily  Labwork:   Return for bloodwork (BMET) in 1-2 weeks. You may come to our office to have this done. It is not a fasting test.  Testing/Procedures:  none  Follow-Up:  With Almyra Deforest PA in 2-3 weeks.  If you need a refill on your cardiac medications before your next appointment, please call your pharmacy.      Hilbert Corrigan, [place]  05/04/2017 6:52 AM    [address], [place], [place]  [address] Phone: [phone]; Fax: [phone]

## 2017-05-04 ENCOUNTER — Encounter: Payer: Self-pay | Admitting: Physician Assistant

## 2017-05-15 LAB — BASIC METABOLIC PANEL
BUN/Creatinine Ratio: 12 (ref 9–20)
BUN: 15 mg/dL (ref 6–24)
CALCIUM: 9.6 mg/dL (ref 8.7–10.2)
CHLORIDE: 106 mmol/L (ref 96–106)
CO2: 20 mmol/L (ref 20–29)
Creatinine, Ser: 1.24 mg/dL (ref 0.76–1.27)
GFR calc Af Amer: 76 mL/min/{1.73_m2} (ref >59)
GFR calc non Af Amer: 66 mL/min/{1.73_m2} (ref >59)
GLUCOSE: 98 mg/dL (ref 65–99)
Potassium: 4.5 mmol/L (ref 3.5–5.2)
Sodium: 142 mmol/L (ref 134–144)

## 2017-05-18 ENCOUNTER — Encounter: Payer: Self-pay | Admitting: Physician Assistant

## 2017-05-18 ENCOUNTER — Ambulatory Visit (INDEPENDENT_AMBULATORY_CARE_PROVIDER_SITE_OTHER): Payer: Self-pay | Admitting: Physician Assistant

## 2017-05-18 ENCOUNTER — Telehealth (HOSPITAL_COMMUNITY): Payer: Self-pay

## 2017-05-18 VITALS — BP 159/96 | HR 89 | Ht 66.0 in | Wt 167.4 lb

## 2017-05-18 DIAGNOSIS — R072 Precordial pain: Secondary | ICD-10-CM

## 2017-05-18 MED ORDER — ATENOLOL 50 MG PO TABS: 50.0000 mg | ORAL_TABLET | Freq: Every day | ORAL | 3 refills | Status: DC

## 2017-05-18 NOTE — Patient Instructions (Signed)
Medication Instructions:   INCREASE ATENOLOL TO 50 MG ONCE DAILY=2 OF THE 25 MG TABLETS ONCE DAILY  Testing/Procedures:  Your physician has requested that you have a lexiscan myoview. For further information please visit [email or web address]. Please follow instruction sheet, as given.TAKE ALL MEDICINES     Follow-Up:  Your physician wants you to follow-up in: 4 MONTHS WITH DR Stanford Breed You will receive a reminder letter in the mail two months in advance. If you don't receive a letter, please call our office to schedule the follow-up appointment.   If you need a refill on your cardiac medications before your next appointment, please call your pharmacy.

## 2017-05-18 NOTE — Progress Notes (Signed)
Cardiology Office Note    Date:  05/18/2017   ID:  Joseph Adkins, DOB 13-Sep-1965, MRN [id number]  PCP:  Patient, No Pcp Per  Cardiologist:  Dr. Stanford Breed  Chief Complaint  Patient presents with  . Follow-up    seen for Dr. Stanford Breed    History of Present Illness:  Joseph Adkins is a 51 y.o. male with PMH of near-syncope and abnormal EKG.  Echocardiogram obtained on 01/06/2017 showed EF 65-70%, grade 1 DD, no significant valvular issue.  He was seen by Dr. Stanford Breed on 01/17/2017, event monitor and a stress echo was ordered.  He was seen in the ED for near syncope in September, creatinine was mildly elevated and he was tachycardic.  Tachycardia improved after hydration.  He was seen again on 02/27/2017 with near syncope and shortness of breath.  Heart monitor obtained on 03/14/2017 showed no significant arrhythmia.  Outpatient stress echocardiogram was ordered for atypical chest pain, however this was unable to be completed due to elevated blood pressure.  He was seen in the ED on 04/27/2017 for chest pain, he described chest pain lasting less than 5 seconds and resolve spontaneously.  Troponin was negative x2.  He presents today for cardiology office visit. I added atenolol 25 mg daily during the last office visit. He has not had any presyncope, dizziness, blurred vision or chest pain since our last visit. He is doing well on the current therapy. His blood pressure remains elevated. I will increase atenolol to 50 mg daily. I will proceed with a Lexiscan Myoview. If negative, he can see Dr. Stanford Breed in about 4 months. If blood pressure is still well controlled at that time, we can start seeing him on a as needed basis.   Past Medical History:  Diagnosis Date  . Hypertension     History reviewed. No pertinent surgical history.  Current Medications: Outpatient Medications Prior to Visit  Medication Sig Dispense Refill  . aspirin 325 MG tablet Take 325 mg by mouth daily.    . Garlic  7782 MG CAPS Take 1 capsule by mouth daily.    Marland Kitchen losartan (COZAAR) 100 MG tablet Take 1 tablet (100 mg total) by mouth daily. 90 tablet 1  . Magnesium 500 MG CAPS Take 1 capsule by mouth daily.    Marland Kitchen atenolol (TENORMIN) 25 MG tablet Take 1 tablet (25 mg total) by mouth daily. 90 tablet 1   No facility-administered medications prior to visit.      Allergies:   Patient has no known allergies.   Social History   Socioeconomic History  . Marital status: Single    Spouse name: None  . Number of children: None  . Years of education: None  . Highest education level: None  Social Needs  . Financial resource strain: None  . Food insecurity - worry: None  . Food insecurity - inability: None  . Transportation needs - medical: None  . Transportation needs - non-medical: None  Occupational History  . Occupation: Teacher, adult education: [employer]  Tobacco Use  . Smoking status: Never Smoker  . Smokeless tobacco: Never Used  Substance and Sexual Activity  . Alcohol use: Yes    Alcohol/week: 0.0 oz    Comment: 16-20 beers per week  . Drug use: No  . Sexual activity: None  Other Topics Concern  . None  Social History Narrative  . None     Family History:  The patient's family history includes Diabetes in  his maternal grandfather, maternal grandmother, mother, paternal grandfather, and paternal grandmother; Hyperlipidemia in his maternal grandfather; Hypertension in his maternal grandfather and paternal grandfather.   ROS:   Please see the history of present illness.    ROS All other systems reviewed and are negative.   PHYSICAL EXAM:   VS:  BP (!) 159/96 Comment: took bp meds around 745 this morning  Pulse 89   Ht 5\' 6"  (1.676 m)   Wt 167 lb 6.4 oz (75.9 kg)   BMI 27.02 kg/m    GEN: Well nourished, well developed, in no acute distress  HEENT: normal  Neck: no JVD, carotid bruits, or masses Cardiac: RRR; no murmurs, rubs, or gallops,no edema  Respiratory:  clear  to auscultation bilaterally, normal work of breathing GI: soft, nontender, nondistended, + BS MS: no deformity or atrophy  Skin: warm and dry, no rash Neuro:  Alert and Oriented x 3, Strength and sensation are intact Psych: euthymic mood, full affect  Wt Readings from Last 3 Encounters:  05/18/17 167 lb 6.4 oz (75.9 kg)  05/02/17 167 lb (75.8 kg)  03/01/17 166 lb 3.2 oz (75.4 kg)      Studies/Labs Reviewed:   EKG:  EKG is not ordered today.   Recent Labs: 01/05/2017: TSH 3.412 02/25/2017: Magnesium 2.0 04/27/2017: Hemoglobin 15.8; Platelets 297 05/15/2017: BUN 15; Creatinine, Ser 1.24; Potassium 4.5; Sodium 142   Lipid Panel    Component Value Date/Time   CHOL 204 (H) 06/23/2015 0942   TRIG 47 06/23/2015 0942   HDL 76 06/23/2015 0942   CHOLHDL 2.7 06/23/2015 0942   VLDL 9 06/23/2015 0942   LDLCALC 119 06/23/2015 0942    Additional studies/ records that were reviewed today include:   Echo 01/06/2017 LV EF: 65% - 70% Study Conclusions  - Left ventricle: The cavity size was normal. There was moderate concentric hypertrophy. Systolic function was vigorous. The estimated ejection fraction was in the range of 65% to 70%. Doppler parameters are consistent with abnormal left ventricular relaxation (grade 1 diastolic dysfunction). There was no evidence of elevated ventricular filling pressure by Doppler parameters. - Aortic valve: There was no regurgitation. - Mitral valve: There was trivial regurgitation. - Right atrium: The atrium was normal in size. - Tricuspid valve: There was trivial regurgitation. - Pulmonic valve: There was trivial regurgitation. - Pulmonary arteries: Systolic pressure was within the normal range. - Pericardium, extracardiac: The pericardium was normal in appearance.    ASSESSMENT:    1. Precordial pain      PLAN:  In order of problems listed above:  1. Presyncope: During the last office visit I have discontinued his  hydrochlorothiazide.  Recent heart monitor did not show significant arrhythmia, he does however has occasional tachycardia.  2. Abnormal EKG: He had a very atypical chest discomfort.  Plan for outpatient [place]  3. Hypertension: I have discontinued his hydrochlorothiazide and added atenolol 25 mg daily.  Blood pressure remain elevated.  Will increase atenolol to 50 mg daily.  Continue losartan 100 mg daily   Medication Adjustments/Labs and Tests Ordered: Current medicines are reviewed at length with the patient today.  Concerns regarding medicines are outlined above.  Medication changes, Labs and Tests ordered today are listed in the Patient Instructions below. Patient Instructions  Medication Instructions:   INCREASE ATENOLOL TO 50 MG ONCE DAILY=2 OF THE 25 MG TABLETS ONCE DAILY  Testing/Procedures:  Your physician has requested that you have a lexiscan myoview. For further information please visit [email or web address]. Please  follow instruction sheet, as given.TAKE ALL MEDICINES     Follow-Up:  Your physician wants you to follow-up in: 4 MONTHS WITH DR Stanford Breed You will receive a reminder letter in the mail two months in advance. If you don't receive a letter, please call our office to schedule the follow-up appointment.   If you need a refill on your cardiac medications before your next appointment, please call your pharmacy.       Hilbert Corrigan, [place]  05/18/2017 8:34 AM    [address], [place], [place]  [address] Phone: [phone]; Fax: [phone]

## 2017-05-18 NOTE — Telephone Encounter (Signed)
Encounter complete. 

## 2017-05-25 ENCOUNTER — Ambulatory Visit (HOSPITAL_COMMUNITY)
Admission: RE | Admit: 2017-05-25 | Discharge: 2017-05-25 | Disposition: A | Discharge status: Home / Self Care | Payer: Self-pay | Source: Ambulatory Visit | Attending: Cardiology | Admitting: Cardiology

## 2017-05-25 DIAGNOSIS — R9431 Abnormal electrocardiogram [ECG] [EKG]: Secondary | ICD-10-CM | POA: Insufficient documentation

## 2017-05-25 DIAGNOSIS — R55 Syncope and collapse: Secondary | ICD-10-CM | POA: Insufficient documentation

## 2017-05-25 DIAGNOSIS — R072 Precordial pain: Secondary | ICD-10-CM | POA: Insufficient documentation

## 2017-05-25 DIAGNOSIS — I10 Essential (primary) hypertension: Secondary | ICD-10-CM | POA: Insufficient documentation

## 2017-05-25 LAB — MYOCARDIAL PERFUSION IMAGING
CHL CUP RESTING HR STRESS: 67 {beats}/min
LVDIAVOL: 117 mL (ref 62–150)
LVSYSVOL: 62 mL
Peak HR: 99 {beats}/min
SDS: 0
SRS: 1
SSS: 1
TID: 1.08

## 2017-05-25 MED ORDER — REGADENOSON 0.4 MG/5ML IV SOLN
0.4000 mg | Freq: Once | INTRAVENOUS | Status: AC
  Administered 2017-05-25: 0.4 mg via INTRAVENOUS

## 2017-05-25 MED ORDER — TECHNETIUM TC 99M TETROFOSMIN IV KIT
9.9000 | PACK | Freq: Once | INTRAVENOUS | Status: AC | PRN
  Administered 2017-05-25: 9.9 via INTRAVENOUS
  Filled 2017-05-25: qty 10

## 2017-05-25 MED ORDER — TECHNETIUM TC 99M TETROFOSMIN IV KIT
31.0000 | PACK | Freq: Once | INTRAVENOUS | Status: AC | PRN
  Administered 2017-05-25: 31 via INTRAVENOUS
  Filled 2017-05-25: qty 31

## 2017-09-06 NOTE — Progress Notes (Signed)
HPI: FU near syncope and abnormal ECG. Patient was admitted August 2018 with near syncopal episodes associated with headaches. Carotid Dopplers August 2018 showed 1-39% bilateral stenosis. Echocardiogram August 2018 showed vigorous LV systolic function, grade 1 diastolic dysfunction. Head CT unremarkable.  Monitor October 2018 showed sinus to sinus tachycardia.  Nuclear study December 2018 showed ejection fraction 47% but visually appeared better.  Perfusion normal.  Since last seen patient denies dyspnea, chest pain or palpitations.  He has had several dizzy spells.  These last approximately 10 minutes and are not associated with position.  He has not had syncope.  No associated palpitations, chest pain or nausea.  Note he was seen in the emergency room April 13 with hypertension and atypical chest pain.  Current Outpatient Medications  Medication Sig Dispense Refill  . aspirin 325 MG tablet Take 325 mg by mouth daily.    . Garlic 9147 MG CAPS Take 1 capsule by mouth daily.    . Magnesium 500 MG CAPS Take 1 capsule by mouth daily.    Marland Kitchen atenolol (TENORMIN) 50 MG tablet Take 1 tablet (50 mg total) by mouth daily. 90 tablet 3  . losartan (COZAAR) 100 MG tablet Take 1 tablet (100 mg total) by mouth daily. 90 tablet 1   No current facility-administered medications for this visit.      Past Medical History:  Diagnosis Date  . Hypertension     History reviewed. No pertinent surgical history.  Social History   Socioeconomic History  . Marital status: Single    Spouse name: Not on file  . Number of children: Not on file  . Years of education: Not on file  . Highest education level: Not on file  Occupational History  . Occupation: Teacher, adult education: [employer]  . Financial resource strain: Not on file  . Food insecurity:    Worry: Not on file    Inability: Not on file  . Transportation needs:    Medical: Not on file    Non-medical: Not on file    Tobacco Use  . Smoking status: Never Smoker  . Smokeless tobacco: Never Used  Substance and Sexual Activity  . Alcohol use: Yes    Alcohol/week: 0.0 oz    Comment: 16-20 beers per week  . Drug use: No  . Sexual activity: Not on file  Lifestyle  . Physical activity:    Days per week: Not on file    Minutes per session: Not on file  . Stress: Not on file  Relationships  . Social connections:    Talks on phone: Not on file    Gets together: Not on file    Attends religious service: Not on file    Active member of club or organization: Not on file    Attends meetings of clubs or organizations: Not on file    Relationship status: Not on file  . Intimate partner violence:    Fear of current or ex partner: Not on file    Emotionally abused: Not on file    Physically abused: Not on file    Forced sexual activity: Not on file  Other Topics Concern  . Not on file  Social History Narrative  . Not on file    Family History  Problem Relation Age of Onset  . Diabetes Mother   . Diabetes Maternal Grandmother   . Diabetes Maternal Grandfather   . Hypertension Maternal Grandfather   .  Hyperlipidemia Maternal Grandfather   . Diabetes Paternal Grandmother   . Diabetes Paternal Grandfather   . Hypertension Paternal Grandfather     ROS: no fevers or chills, productive cough, hemoptysis, dysphasia, odynophagia, melena, hematochezia, dysuria, hematuria, rash, seizure activity, orthopnea, PND, pedal edema, claudication. Remaining systems are negative.  Physical Exam: Well-developed well-nourished in no acute distress.  Skin is warm and dry.  HEENT is normal.  Neck is supple.  Chest is clear to auscultation with normal expansion.  Cardiovascular exam is regular rate and rhythm.  Abdominal exam nontender or distended. No masses palpated. Extremities show no edema. neuro grossly intact  Electrocardiogram September 09, 2017 showed sinus rhythm, left atrial enlargement, left ventricular  hypertrophy and lateral T wave inversion.  Personally reviewed.  A/P  1 chest pain-symptoms atypical.  Nuclear study negative.  No plans for further ischemia evaluation.  2 history of near syncope-patient describes occasional dizzy episodes that lasts for 10 minutes and not related to position.  No syncope.  LV function normal and previous monitor negative though patient did not have symptoms with monitor in place.  Not clear to me this is cardiac.  We discussed the importance of hydration.  We will follow and assess further as needed.  3 hypertension-blood pressure is elevated.  Add amlodipine 5 mg daily and follow.  Kirk Ruths, MD

## 2017-09-09 ENCOUNTER — Encounter (HOSPITAL_COMMUNITY): Payer: Self-pay

## 2017-09-09 ENCOUNTER — Emergency Department (HOSPITAL_COMMUNITY)
Admission: EM | Admit: 2017-09-09 | Discharge: 2017-09-09 | Disposition: A | Discharge status: Home / Self Care | Payer: Self-pay | Attending: Emergency Medicine | Admitting: Emergency Medicine

## 2017-09-09 ENCOUNTER — Other Ambulatory Visit: Payer: Self-pay

## 2017-09-09 ENCOUNTER — Emergency Department (HOSPITAL_COMMUNITY): Payer: Self-pay

## 2017-09-09 DIAGNOSIS — Z79899 Other long term (current) drug therapy: Secondary | ICD-10-CM | POA: Insufficient documentation

## 2017-09-09 DIAGNOSIS — Z7982 Long term (current) use of aspirin: Secondary | ICD-10-CM | POA: Insufficient documentation

## 2017-09-09 DIAGNOSIS — R0789 Other chest pain: Secondary | ICD-10-CM | POA: Insufficient documentation

## 2017-09-09 DIAGNOSIS — I10 Essential (primary) hypertension: Secondary | ICD-10-CM | POA: Insufficient documentation

## 2017-09-09 LAB — CBC
HCT: 40.6 % (ref 39.0–52.0)
Hemoglobin: 14.1 g/dL (ref 13.0–17.0)
MCH: 31.8 pg (ref 26.0–34.0)
MCHC: 34.7 g/dL (ref 30.0–36.0)
MCV: 91.6 fL (ref 78.0–100.0)
PLATELETS: 299 10*3/uL (ref 150–400)
RBC: 4.43 MIL/uL (ref 4.22–5.81)
RDW: 12.8 % (ref 11.5–15.5)
WBC: 3.9 10*3/uL — AB (ref 4.0–10.5)

## 2017-09-09 LAB — BASIC METABOLIC PANEL
Anion gap: 8 (ref 5–15)
BUN: 10 mg/dL (ref 6–20)
CALCIUM: 9.1 mg/dL (ref 8.9–10.3)
CO2: 23 mmol/L (ref 22–32)
CREATININE: 1.21 mg/dL (ref 0.61–1.24)
Chloride: 106 mmol/L (ref 101–111)
GFR calc Af Amer: >60 mL/min (ref >60)
Glucose, Bld: 146 mg/dL — ABNORMAL HIGH (ref 65–99)
POTASSIUM: 4 mmol/L (ref 3.5–5.1)
SODIUM: 137 mmol/L (ref 135–145)

## 2017-09-09 LAB — I-STAT TROPONIN, ED: TROPONIN I, POC: 0.01 ng/mL (ref 0.00–0.08)

## 2017-09-09 NOTE — ED Provider Notes (Signed)
[facility] EMERGENCY DEPARTMENT Provider Note   CSN: [id number] Arrival date & time: 09/09/17  1123     History   Chief Complaint Chief Complaint  Patient presents with  . Chest Pain    HPI Joseph Adkins is a 52 y.o. male presenting for evaluation of chest pain and lightheadedness.  She states he had multiple episodes of similar symptoms last year.  He was evaluated extensively in the ER and by cardiology, with a mostly negative workup.  He was started on blood pressure medicine, and has been doing well for the past several months.  Yesterday he was at rest when he felt a sharp central chest pain.  This last for about 5 or 6 seconds before resolving.  This occurred again this morning.  Later this morning, he felt lightheaded.  This lasts for about 10 minutes before resolving.  He denies fall, trauma, or injury.  He is currently without chest pain or feeling lightheaded.  He took his blood pressure medicine this morning around 8:30.  There is been no change in dose of blood pressure medicine over the past 4-5 months.  He states over the past week, his blood pressure has been higher than normal, around 154-008 systolic.  He denies change in stress, activity level, eating habits, hydration.  He denies headache, vision changes, slurred speech, weakness, numbness, shortness of breath, nausea, vomiting, abdominal pain, urinary symptoms, abnormal bowel movements, or leg pain or swelling.  No recent travel, surgeries, immobilization, or trauma.  No history of hormone use, prior DVT, or cancer.  No history of diabetes.  He is a never-smoker.  No prior history of ACS or family history of ACS.  HPI  Past Medical History:  Diagnosis Date  . Hypertension     Patient Active Problem List   Diagnosis Date Noted  . Near syncope 01/05/2017    History reviewed. No pertinent surgical history.      Home Medications    Prior to Admission medications   Medication Sig Start  Date End Date Taking? Authorizing Provider  aspirin 325 MG tablet Take 325 mg by mouth daily.    [provider]  atenolol (TENORMIN) 50 MG tablet Take 1 tablet (50 mg total) by mouth daily. 05/18/17 08/16/17  Almyra Deforest, PA  Garlic 6761 MG CAPS Take 1 capsule by mouth daily.    [provider]  losartan (COZAAR) 100 MG tablet Take 1 tablet (100 mg total) by mouth daily. 05/02/17 07/31/17  Almyra Deforest, PA  Magnesium 500 MG CAPS Take 1 capsule by mouth daily.    [provider]    Family History Family History  Problem Relation Age of Onset  . Diabetes Mother   . Diabetes Maternal Grandmother   . Diabetes Maternal Grandfather   . Hypertension Maternal Grandfather   . Hyperlipidemia Maternal Grandfather   . Diabetes Paternal Grandmother   . Diabetes Paternal Grandfather   . Hypertension Paternal Grandfather     Social History Social History   Tobacco Use  . Smoking status: Never Smoker  . Smokeless tobacco: Never Used  Substance Use Topics  . Alcohol use: Yes    Alcohol/week: 0.0 oz    Comment: 16-20 beers per week  . Drug use: No     Allergies   Patient has no known allergies.   Review of Systems Review of Systems  Cardiovascular: Positive for chest pain (resolved).  Neurological: Positive for light-headedness (resolved).  All other systems reviewed and are  negative.    Physical Exam Updated Vital Signs BP (!) 160/91   Pulse 66   Temp 98 F (36.7 C) (Oral)   Resp 15   Ht 5\' 6"  (1.676 m)   Wt 74.8 kg (165 lb)   SpO2 99%   BMI 26.63 kg/m   Physical Exam  Constitutional: He is oriented to person, place, and time. He appears well-developed and well-nourished. No distress.  Sitting comfortably in the chair no apparent distress.  HENT:  Head: Normocephalic and atraumatic.  Eyes: Pupils are equal, round, and reactive to light. Conjunctivae and EOM are normal.  Neck: Normal range of motion. Neck supple.  Cardiovascular: Normal rate,  regular rhythm and intact distal pulses.  Pulmonary/Chest: Effort normal and breath sounds normal. No respiratory distress. He has no wheezes. He exhibits no tenderness.  Abdominal: Soft. He exhibits no distension. There is no tenderness. There is no guarding.  Musculoskeletal: Normal range of motion.  No leg pain or swelling. Radial and pedal pulses intact bilaterally.   Neurological: He is alert and oriented to person, place, and time.  Skin: Skin is warm and dry.  Psychiatric: He has a normal mood and affect.  Nursing note and vitals reviewed.    ED Treatments / Results  Labs (all labs ordered are listed, but only abnormal results are displayed) Labs Reviewed  BASIC METABOLIC PANEL - Abnormal; Notable for the following components:      Result Value   Glucose, Bld 146 (*)    All other components within normal limits  CBC - Abnormal; Notable for the following components:   WBC 3.9 (*)    All other components within normal limits  I-STAT TROPONIN, ED    EKG EKG Interpretation  Date/Time:  Saturday September 09 2017 11:28:28 EDT Ventricular Rate:  78 PR Interval:  142 QRS Duration: 90 QT Interval:  374 QTC Calculation: 426 R Axis:   -31 Text Interpretation:  Normal sinus rhythm Possible Left atrial enlargement Left axis deviation Left ventricular hypertrophy Nonspecific T wave abnormality Abnormal ECG Since last tracing ischemic abnormality has resolved Confirmed by Daleen Bo [phone]) on 09/09/2017 12:06:29 PM Also confirmed by Daleen Bo [phone]), editor Laurena Spies [phone])  on 09/09/2017 12:29:07 PM   Radiology Dg Chest 2 View  Result Date: 09/09/2017 CLINICAL DATA:  Syncopal episodes off and on for couple weeks, sharp LEFT chest pain, lightheaded EXAM: CHEST - 2 VIEW COMPARISON:  04/27/2017 FINDINGS: Normal heart size, mediastinal contours, and pulmonary vascularity. Lungs clear. No pleural effusion or pneumothorax. Bones unremarkable. IMPRESSION: Normal exam.  Electronically Signed   By: Lavonia Dana M.D.   On: 09/09/2017 11:58    Procedures Procedures (including critical care time)  Medications Ordered in ED Medications - No data to display   Initial Impression / Assessment and Plan / ED Course  I have reviewed the triage vital signs and the nursing notes.  Pertinent labs & imaging results that were available during my care of the patient were reviewed by me and considered in my medical decision making (see chart for details).     Presenting for evaluation of chest pain and lightheadedness, both of which have resolved.  Physical exam reassuring, he is afebrile and not tachycardic.  He appears nontoxic.  Currently without pain or symptoms.  Blood pressure initially elevated, but improved while in the ER.  History of similar symptoms in the past, extensive workup by cardiology has been negative for concerning findings.  Heart score of 2, doubt ACS, PE,  infection as etiology.  Likely related to blood pressure.  Labs reassuring, no sign of endorgan damage.  Chest x-ray without signs of infection or abnormality.  EKG improved from prior.  Discussed findings with patient.  Discussed importance of follow-up with his doctor for further management of blood pressure.  At this time, patient appears safe for discharge.  Return precautions given.  Patient states he understands and agrees to plan.   Final Clinical Impressions(s) / ED Diagnoses   Final diagnoses:  Hypertension, unspecified type  Atypical chest pain    ED Discharge Orders    None       Franchot Heidelberg, PA-C 09/09/17 1406    Daleen Bo, MD 09/09/17 2245

## 2017-09-09 NOTE — ED Triage Notes (Signed)
Pt endorses left sided chest pain since yesterday with lightheadedness. Pt has hx of multiple episodes of same sx and cardiology "can't find anything wrong with me" VSS. Denies shob n/v

## 2017-09-09 NOTE — Discharge Instructions (Addendum)
Continue taking your medication as prescribed.  It is important that you follow up with your doctor for further evaluation and management of your blood pressure.  Return to the ER if you develop persistent pain, difficulty breathing, persistent dizziness, or any new or worsening symptoms.

## 2017-09-20 ENCOUNTER — Encounter: Payer: Self-pay | Admitting: Cardiology

## 2017-09-20 ENCOUNTER — Ambulatory Visit (INDEPENDENT_AMBULATORY_CARE_PROVIDER_SITE_OTHER): Payer: Self-pay | Admitting: Cardiology

## 2017-09-20 VITALS — BP 150/100 | HR 73 | Ht 66.0 in | Wt 167.0 lb

## 2017-09-20 DIAGNOSIS — I10 Essential (primary) hypertension: Secondary | ICD-10-CM

## 2017-09-20 DIAGNOSIS — R55 Syncope and collapse: Secondary | ICD-10-CM

## 2017-09-20 DIAGNOSIS — R072 Precordial pain: Secondary | ICD-10-CM

## 2017-09-20 MED ORDER — AMLODIPINE BESYLATE 5 MG PO TABS: 5.0000 mg | ORAL_TABLET | Freq: Every day | ORAL | 3 refills | Status: DC

## 2017-09-20 NOTE — Patient Instructions (Signed)
Medication Instructions:   START AMLODIPINE 5 MG ONCE DAILY  Follow-Up:  Your physician recommends that you schedule a follow-up appointment in: [place] EXTENDER=MAKE SURE TO BRING BLOOD PRESSURE MACHINE TO VISIT  Your physician wants you to follow-up in: [place] will receive a reminder letter in the mail two months in advance. If you don't receive a letter, please call our office to schedule the follow-up appointment.    If you need a refill on your cardiac medications before your next appointment, please call your pharmacy.

## 2017-10-12 ENCOUNTER — Other Ambulatory Visit: Payer: Self-pay | Admitting: Physician Assistant

## 2017-10-20 NOTE — Progress Notes (Deleted)
     10/20/2017 Joseph Adkins Oct 06, 1965 [id number]   HPI:  Joseph Adkins is a 52 y.o. male patient of Dr ***, with a PMH below who presents today for hypertension clinic evaluation.  Blood Pressure Goal:  130/80  Current Medications:  Cardiac Hx:  Family Hx:  Social Hx:  Diet:  Exercise:  Home BP readings:  Intolerances:   CrCl cannot be calculated (Patient's most recent lab result is older than the maximum 21 days allowed.).  Wt Readings from Last 3 Encounters:  09/20/17 167 lb (75.8 kg)  09/09/17 165 lb (74.8 kg)  05/25/17 167 lb (75.8 kg)   BP Readings from Last 3 Encounters:  09/20/17 (!) 150/100  09/09/17 (!) 160/91  05/18/17 (!) 159/96   Pulse Readings from Last 3 Encounters:  09/20/17 73  09/09/17 66  05/18/17 89    Current Outpatient Medications  Medication Sig Dispense Refill  . amLODipine (NORVASC) 5 MG tablet Take 1 tablet (5 mg total) by mouth daily. 90 tablet 3  . aspirin 325 MG tablet Take 325 mg by mouth daily.    Marland Kitchen atenolol (TENORMIN) 50 MG tablet Take 1 tablet (50 mg total) by mouth daily. 90 tablet 3  . Garlic 1829 MG CAPS Take 1 capsule by mouth daily.    Marland Kitchen losartan (COZAAR) 100 MG tablet TAKE 1 TABLET(100 MG) BY MOUTH DAILY 90 tablet 3  . Magnesium 500 MG CAPS Take 1 capsule by mouth daily.     No current facility-administered medications for this visit.     No Known Allergies  Past Medical History:  Diagnosis Date  . Hypertension     There were no vitals taken for this visit.  No problem-specific Assessment & Plan notes found for this encounter.   [name] PharmD CPP [facility] [address] [place], [place] [address] [phone]

## 2018-08-09 ENCOUNTER — Other Ambulatory Visit: Payer: Self-pay

## 2018-08-09 MED ORDER — ATENOLOL 50 MG PO TABS: 50.0000 mg | ORAL_TABLET | Freq: Every day | ORAL | 0 refills | Status: DC

## 2018-08-09 NOTE — Telephone Encounter (Signed)
Rx(s) sent to pharmacy electronically.  

## 2018-09-21 IMAGING — DX DG CHEST 2V
2 series · 2 of 2 positions shown · non-contrast
Comparison: None

CLINICAL DATA: Faint feelings, near syncopal

EXAM:
CHEST  2 VIEW

[chest pa]
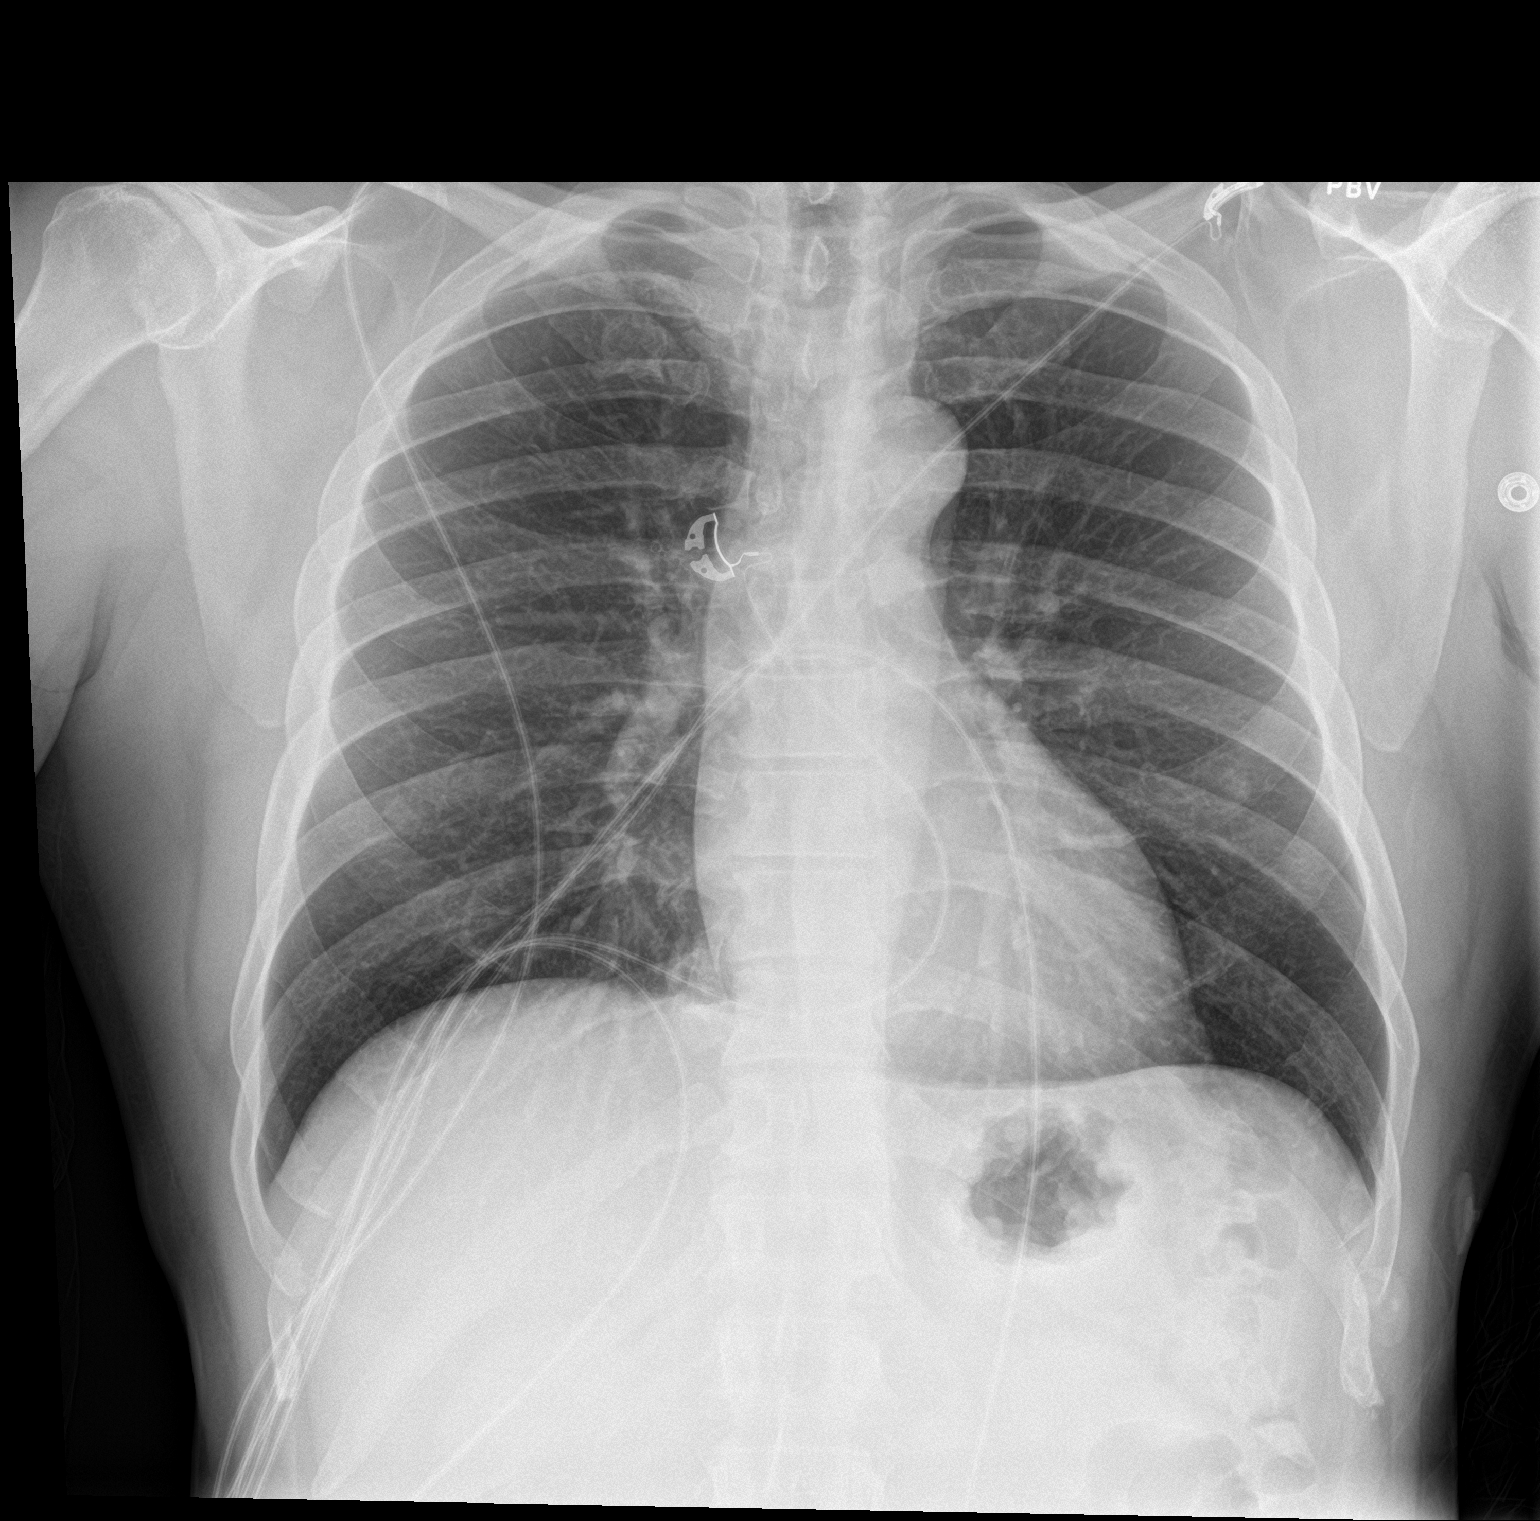

[chest lat]
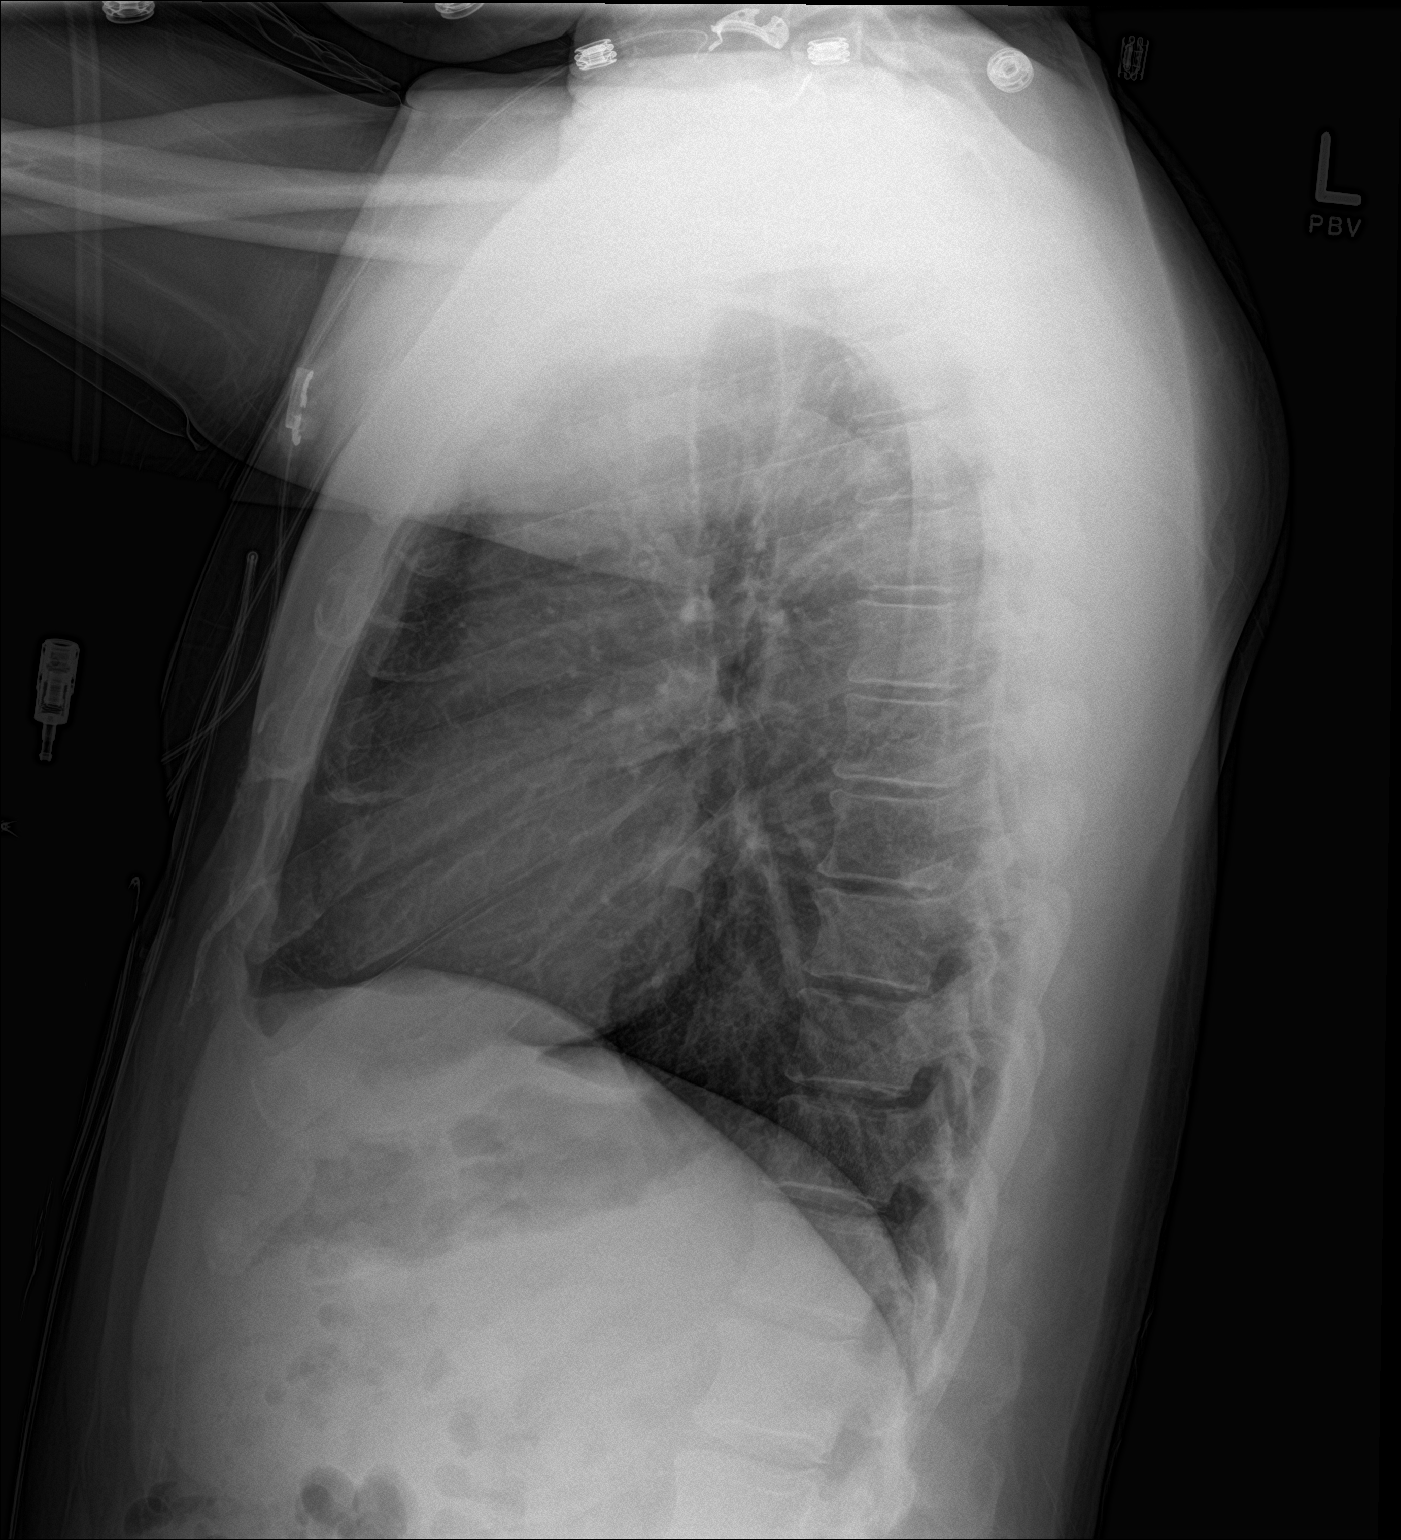

[2 of 2 positions shown; findings below may reference images not displayed]

FINDINGS: Normal heart size, mediastinal contours, and pulmonary vascularity.

Lungs clear.

No pleural effusion or pneumothorax.

Question subtle LEFT nipple shadow.

Bones unremarkable.
IMPRESSION: No acute abnormalities.

**An incidental finding of potential clinical significance has been
found.

Question LEFT nipple shadow.

Repeat PA chest radiograph with nipple markers recommended to
exclude pulmonary nodule.**

## 2018-10-08 ENCOUNTER — Other Ambulatory Visit: Payer: Self-pay | Admitting: Cardiology

## 2018-10-08 DIAGNOSIS — I10 Essential (primary) hypertension: Secondary | ICD-10-CM

## 2018-11-04 ENCOUNTER — Encounter (HOSPITAL_COMMUNITY): Payer: Self-pay

## 2018-11-04 ENCOUNTER — Emergency Department (HOSPITAL_COMMUNITY): Payer: Self-pay

## 2018-11-04 ENCOUNTER — Other Ambulatory Visit: Payer: Self-pay

## 2018-11-04 ENCOUNTER — Emergency Department (HOSPITAL_COMMUNITY)
Admission: EM | Admit: 2018-11-04 | Discharge: 2018-11-05 | Disposition: A | Discharge status: Home / Self Care | Payer: Self-pay | Attending: Emergency Medicine | Admitting: Emergency Medicine

## 2018-11-04 DIAGNOSIS — R0789 Other chest pain: Secondary | ICD-10-CM | POA: Insufficient documentation

## 2018-11-04 DIAGNOSIS — Z79899 Other long term (current) drug therapy: Secondary | ICD-10-CM | POA: Insufficient documentation

## 2018-11-04 DIAGNOSIS — I10 Essential (primary) hypertension: Secondary | ICD-10-CM | POA: Insufficient documentation

## 2018-11-04 LAB — BASIC METABOLIC PANEL
Anion gap: 11 (ref 5–15)
BUN: 10 mg/dL (ref 6–20)
CO2: 23 mmol/L (ref 22–32)
Calcium: 9.1 mg/dL (ref 8.9–10.3)
Chloride: 107 mmol/L (ref 98–111)
Creatinine, Ser: 1.32 mg/dL — ABNORMAL HIGH (ref 0.61–1.24)
GFR calc Af Amer: >60 mL/min (ref >60)
GFR calc non Af Amer: >60 mL/min (ref >60)
Glucose, Bld: 138 mg/dL — ABNORMAL HIGH (ref 70–99)
Potassium: 3.5 mmol/L (ref 3.5–5.1)
Sodium: 141 mmol/L (ref 135–145)

## 2018-11-04 LAB — CBC
HCT: 40.3 % (ref 39.0–52.0)
Hemoglobin: 13.8 g/dL (ref 13.0–17.0)
MCH: 31.5 pg (ref 26.0–34.0)
MCHC: 34.2 g/dL (ref 30.0–36.0)
MCV: 92 fL (ref 80.0–100.0)
Platelets: 313 10*3/uL (ref 150–400)
RBC: 4.38 MIL/uL (ref 4.22–5.81)
RDW: 12.9 % (ref 11.5–15.5)
WBC: 4.8 10*3/uL (ref 4.0–10.5)
nRBC: 0 % (ref 0.0–0.2)

## 2018-11-04 LAB — TROPONIN I: Troponin I: <0.03 ng/mL (ref <0.03)

## 2018-11-04 MED ORDER — SODIUM CHLORIDE 0.9% FLUSH: 3.0000 mL | Freq: Once | INTRAVENOUS | Status: DC

## 2018-11-04 NOTE — ED Triage Notes (Signed)
Pt reports intermittent chest pain and hypertension. Pt took is last BP pill today. Denies any pain at this time.

## 2018-11-05 ENCOUNTER — Other Ambulatory Visit: Payer: Self-pay

## 2018-11-05 LAB — TROPONIN I: Troponin I: <0.03 ng/mL (ref <0.03)

## 2018-11-05 MED ORDER — LOSARTAN POTASSIUM 100 MG PO TABS: 100.0000 mg | ORAL_TABLET | Freq: Every day | ORAL | 0 refills | Status: DC

## 2018-11-05 NOTE — Telephone Encounter (Signed)
Rx(s) sent to pharmacy electronically.  

## 2018-11-05 NOTE — ED Provider Notes (Signed)
[facility] EMERGENCY DEPARTMENT Provider Note   CSN: [id number] Arrival date & time: 11/04/18  2107    History   Chief Complaint Chief Complaint  Patient presents with  . Hypertension  . Chest Pain    HPI Joseph Adkins is a 53 y.o. male with a PMH of HTN presents with intermittent left sided chest pain onset a few weeks ago. Patient describes pain as, "Twisting or corkscrew sensation." Patient denies symptoms currently. Patient states last episode was last night. Patient states symptoms occur randomly and last a few seconds. Patient states symptoms resolve on their own. Patient states nothing makes pain better or worse. Patient states he has not taken anything for his symptoms. Patient reports he has been more active over the last few weeks and does 100 push ups per day. Patient localizes pain to his left axilla. Patient denies DOE, SOB, chest tightness or pressure, radiation to left arm, jaw or back, nausea, or diaphoresis. Patient denies a personal or family cardiac history. Patient states he has been compliant with his blood pressure medications and took them this morning. Patient denies recent surgery, leg edema/pain, or hx of DVT/PE.  Patient reports occasional alcohol and marijuana use. Patient denies any other drug use. Patient denies tobacco use. Patient denies fever, chills, cough, congestion, sick exposures, or recent travel.        HPI  Past Medical History:  Diagnosis Date  . Hypertension     Patient Active Problem List   Diagnosis Date Noted  . Near syncope 01/05/2017    History reviewed. No pertinent surgical history.      Home Medications    Prior to Admission medications   Medication Sig Start Date End Date Taking? Authorizing Provider  amLODipine (NORVASC) 5 MG tablet TAKE 1 TABLET(5 MG) BY MOUTH DAILY Patient taking differently: Take 5 mg by mouth daily.  10/08/18  Yes Lelon Perla, MD  atenolol (TENORMIN) 50 MG tablet Take 1  tablet (50 mg total) by mouth daily. 08/09/18 11/07/18 Yes Crenshaw, Denice Bors, MD  Flaxseed, Linseed, (FLAX SEED OIL) 1000 MG CAPS Take 1,000 mg by mouth daily.   Yes [provider]  losartan (COZAAR) 100 MG tablet TAKE 1 TABLET(100 MG) BY MOUTH DAILY Patient taking differently: Take 100 mg by mouth daily.  10/12/17  Yes Almyra Deforest, PA    Family History Family History  Problem Relation Age of Onset  . Diabetes Mother   . Diabetes Maternal Grandmother   . Diabetes Maternal Grandfather   . Hypertension Maternal Grandfather   . Hyperlipidemia Maternal Grandfather   . Diabetes Paternal Grandmother   . Diabetes Paternal Grandfather   . Hypertension Paternal Grandfather     Social History Social History   Tobacco Use  . Smoking status: Never Smoker  . Smokeless tobacco: Never Used  Substance Use Topics  . Alcohol use: Yes    Alcohol/week: 0.0 standard drinks    Comment: 16-20 beers per week  . Drug use: No     Allergies   Patient has no known allergies.   Review of Systems Review of Systems  Constitutional: Negative for activity change, appetite change, chills, diaphoresis, fatigue, fever and unexpected weight change.  HENT: Negative for congestion and rhinorrhea.   Respiratory: Negative for cough, chest tightness, shortness of breath and wheezing.   Cardiovascular: Positive for chest pain. Negative for palpitations and leg swelling.  Gastrointestinal: Negative for abdominal pain, nausea and vomiting.  Endocrine: Negative for cold intolerance and  heat intolerance.  Genitourinary: Negative for dysuria.  Musculoskeletal: Negative for back pain.  Skin: Negative for rash.  Allergic/Immunologic: Negative for immunocompromised state.  Neurological: Negative for dizziness, syncope, weakness and light-headedness.  Psychiatric/Behavioral: Negative for agitation and behavioral problems. The patient is not nervous/anxious.     Physical Exam Updated Vital Signs BP (!)  155/100   Pulse 69   Temp 98.3 F (36.8 C) (Oral)   Resp 16   SpO2 100%   Physical Exam Vitals signs and nursing note reviewed.  Constitutional:      General: He is not in acute distress.    Appearance: He is well-developed. He is not diaphoretic.  HENT:     Head: Normocephalic and atraumatic.  Neck:     Musculoskeletal: Normal range of motion and neck supple.     Vascular: No JVD.  Cardiovascular:     Rate and Rhythm: Normal rate and regular rhythm.     Pulses: Normal pulses.          Radial pulses are 2+ on the right side and 2+ on the left side.       Dorsalis pedis pulses are 2+ on the right side and 2+ on the left side.     Heart sounds: Normal heart sounds. No murmur. No friction rub. No gallop.   Pulmonary:     Effort: Pulmonary effort is normal. No respiratory distress.     Breath sounds: Normal breath sounds. No wheezing, rhonchi or rales.  Chest:     Chest wall: No tenderness.  Abdominal:     Palpations: Abdomen is soft.     Tenderness: There is no abdominal tenderness.  Musculoskeletal: Normal range of motion.     Right lower leg: He exhibits no tenderness. No edema.     Left lower leg: He exhibits no tenderness. No edema.  Skin:    Capillary Refill: Capillary refill takes less than 2 seconds.     Coloration: Skin is not pale.     Findings: No rash.  Neurological:     Mental Status: He is alert.      ED Treatments / Results  Labs (all labs ordered are listed, but only abnormal results are displayed) Labs Reviewed  BASIC METABOLIC PANEL - Abnormal; Notable for the following components:      Result Value   Glucose, Bld 138 (*)    Creatinine, Ser 1.32 (*)    All other components within normal limits  CBC  TROPONIN I  TROPONIN I    EKG EKG Interpretation  Date/Time:  Monday November 05 2018 02:12:28 EDT Ventricular Rate:  70 PR Interval:    QRS Duration: 97 QT Interval:  387 QTC Calculation: 418 R Axis:   8 Text Interpretation:  Sinus rhythm  Probable left atrial enlargement LVH by voltage no change since previous Confirmed by Dory Horn) on 11/05/2018 2:16:49 AM   Radiology Dg Chest 2 View  Result Date: 11/04/2018 CLINICAL DATA:  53 year old male with chest pain and hypertension, 413 systolic. EXAM: CHEST - 2 VIEW COMPARISON:  09/09/2017 and earlier. FINDINGS: Lung volumes and mediastinal contours remain normal. Visualized tracheal air column is within normal limits. Both lungs remain clear. No pneumothorax or pleural effusion. Negative visible osseous structures and bowel gas pattern. IMPRESSION: Negative.  No cardiopulmonary abnormality. Electronically Signed   By: Genevie Ann M.D.   On: 11/04/2018 22:03    Procedures Procedures (including critical care time)  Medications Ordered in ED Medications  sodium chloride flush (  NS) 0.9 % injection 3 mL (has no administration in time range)     Initial Impression / Assessment and Plan / ED Course  I have reviewed the triage vital signs and the nursing notes.  Pertinent labs & imaging results that were available during my care of the patient were reviewed by me and considered in my medical decision making (see chart for details).  Clinical Course as of Nov 05 426  Mon Nov 05, 2018  0209 Negative.  No cardiopulmonary abnormality noted on CXR.  DG Chest 2 View [AH]  0421 CBC is unremarkable.   CBC [AH]  0422 BMP reveals hyperglycemia at 138 and mildly elevated creatinine at 1.32. This appears to be similar to previous values.  Basic metabolic panel(!) [AH]  6811 Troponin negative x 2.  Troponin I - ONCE - STAT [AH]    Clinical Course User Index [AH] Arville Lime, PA-C      Patient is to be discharged with recommendation to follow up with PCP in regards to today's hospital visit. Chest pain is not likely of cardiac or pulmonary etiology d/t presentation, VSS, no tracheal deviation, no JVD or new murmur, RRR, breath sounds equal bilaterally, EKG without acute  abnormalities, negative troponin x2, and negative CXR. Pt has been advised to return to the ED if CP becomes exertional, associated with diaphoresis or nausea, radiates to left jaw/arm, worsens or becomes concerning in any way. Patient has not had any chest pain while in the ER. Discussed elevated BP during today's visit. Patient denies any symptoms of hypertensive crisis while in the ER. Advised patient to follow up with PCP. Patient is stable in no acute distress. Pt appears reliable for follow up and is agreeable to discharge.   Case has been discussed with Dr. Randal Buba.   Final Clinical Impressions(s) / ED Diagnoses   Final diagnoses:  Essential hypertension  Intermittent left-sided chest pain    ED Discharge Orders    None       Arville Lime, PA-C 11/05/18 0430    Palumbo, April, MD 11/05/18 [phone]

## 2018-11-05 NOTE — ED Notes (Signed)
Patient verbalizes understanding of discharge instructions. Opportunity for questioning and answers were provided. Armband removed by staff, pt discharged from ED ambulatory.   

## 2018-11-05 NOTE — Discharge Instructions (Addendum)
You have been seen today for chest pain. Please read and follow all provided instructions.  ° °1. Medications: usual home medications °2. Treatment: rest, drink plenty of fluids °3. Follow Up: Please follow up with your primary doctor in 2 days for discussion of your diagnoses and further evaluation after today's visit; if you do not have a primary care doctor use the resource guide provided to find one; Please return to the ER for any new or worsening symptoms. Please obtain all of your results from medical records or have your doctors office obtain the results - share them with your doctor - you should be seen at your doctors office. Call today to arrange your follow up.  ° °Take medications as prescribed. Please review all of the medicines and only take them if you do not have an allergy to them. Return to the emergency room for worsening condition or new concerning symptoms. Follow up with your regular doctor. If you don't have a regular doctor use one of the numbers below to establish a primary care doctor. ° °Please be aware that if you are taking birth control pills, taking other prescriptions, ESPECIALLY ANTIBIOTICS may make the birth control ineffective - if this is the case, either do not engage in sexual activity or use alternative methods of birth control such as condoms until you have finished the medicine and your family doctor says it is OK to restart them. If you are on a blood thinner such as COUMADIN, be aware that any other medicine that you take may cause the coumadin to either work too much, or not enough - you should have your coumadin level rechecked in next 7 days if this is the case.  °?  °It is also a possibility that you have an allergic reaction to any of the medicines that you have been prescribed - Everybody reacts differently to medications and while MOST people have no trouble with most medicines, you may have a reaction such as nausea, vomiting, rash, swelling, shortness of breath.  If this is the case, please stop taking the medicine immediately and contact your physician.  °?  °You should return to the ER if you develop severe or worsening symptoms.  ° °Emergency Department Resource Guide °1) Find a Doctor and Pay Out of Pocket °Although you won't have to find out who is covered by your insurance plan, it is a good idea to ask around and get recommendations. You will then need to call the office and see if the doctor you have chosen will accept you as a new patient and what types of options they offer for patients who are self-pay. Some doctors offer discounts or will set up payment plans for their patients who do not have insurance, but you will need to ask so you aren't surprised when you get to your appointment. ° °2) Contact Your Local Health Department °Not all health departments have doctors that can see patients for sick visits, but many do, so it is worth a call to see if yours does. If you don't know where your local health department is, you can check in your phone book. The CDC also has a tool to help you locate your state's health department, and many state websites also have listings of all of their local health departments. ° °3) Find a Walk-in Clinic °If your illness is not likely to be very severe or complicated, you may want to try a walk in clinic. These are popping up all over   the country in pharmacies, drugstores, and shopping centers. They're usually staffed by nurse practitioners or physician assistants that have been trained to treat common illnesses and complaints. They're usually fairly quick and inexpensive. However, if you have serious medical issues or chronic medical problems, these are probably not your best option. ° °No Primary Care Doctor: °Call [facility] at  832-8000 - they can help you locate a primary care doctor that  accepts your insurance, provides certain services, etc. °Physician Referral Service- 1-800-533-3463 ° °Chronic Pain  Problems: °Organization         Address  Phone   Notes  °[facility]  ([phone]) [phone] Patients need to be referred by their primary care doctor.  ° °Medication Assistance: °Organization         Address  Phone   Notes  °[place] [facility] [address] °[place], [place] [address] [phone]) [phone] --Must be a resident of [place] °-- Must have NO insurance coverage whatsoever (no Medicaid/ Medicare, etc.) °-- The pt. MUST have a primary care doctor that directs their care regularly and follows them in the community °  °[facility]  (866) 331-1348   °[employer]  (888) 892-1162   ° °Agencies that provide inexpensive medical care: °Organization         Address  Phone   Notes  °[facility]  ([phone]) [phone]   °[facility]    ([phone]) 832-7272   °[facility] [address] °[place], [place] [address] ([phone]) [phone]   °[facility] [address], °[place] (336) [phone]   °[facility]    (336) 373-0678   °[facility]    (336) 272-1050   °[facility] ° [address], [place] Phone:  (336) 832-4444, Fax:  (336) [phone] Hours of Operation:  9 am - 6 pm, M-F.  Also accepts Medicaid/Medicare and self-pay.  ° [facility] ° [address], [place] Phone: (336) [phone], Fax: ([phone]) [phone]. Hours of Operation:  8:30 am - 5:30 pm, M-F.  Also accepts Medicaid and self-pay.  °[facility] [place] [address], [place] Phone: (336) [phone]   °[facility] [address], [place], [place] (336)[phone], Ext. 123 Mondays & Thursdays: 7-9 AM.  First 15 patients are seen on a first come, first serve basis. °  ° °Medicaid-accepting [place] Providers: ° °Organization         Address  Phone   Notes  °[facility] [address], [place] ([phone]) [phone] Also  accepts self-pay patients.  °[facility] [address], [place] ° (336) [phone]   °[facility] [address], [place] ([phone]) [phone]   °[facility] [address] [place] [address], [place] ([phone]) [phone]   °[facility] [address], [place]  ° ([phone]) [phone] Only accepts [place] Access Medicaid patients after they have their name applied to their card.  ° °Self-Pay (no insurance) in [place]: ° °Organization         Address  Phone   Notes  °Sickle Cell Patients, [facility] [address], [place] ([phone]) [phone]   °[facility] [address], [place] ([phone]) [phone]   °[facility] ° [address], [place] ([phone]) [phone]   °[facility]/Dr. Osei-Bonsu ° [address] [place] [address], [place] or [address], [place] [phone]) [phone] Phone number for both [place] and [place] locations is the same.  °[facility] [address], [place] ([phone]) [phone]   °[facility] [address] [place] [address], [place] or [address] ([phone]) [phone] °(336) [phone]   °[facility] [address], [place] (336) [phone], phone; ([phone]) [phone], fax Sees patients 1st and 3rd Saturday of every month.  Must not qualify for public or private insurance (i.e. Medicaid, Medicare, [place] Health Choice, Veterans' Benefits)  Household income should be no more than 200% of the poverty level The clinic cannot treat you if you are pregnant or think you are pregnant  Sexually transmitted diseases are not treated at the clinic.  °  ° °

## 2018-11-11 ENCOUNTER — Other Ambulatory Visit: Payer: Self-pay | Admitting: Cardiology

## 2018-12-02 ENCOUNTER — Other Ambulatory Visit: Payer: Self-pay | Admitting: Cardiology

## 2018-12-03 ENCOUNTER — Other Ambulatory Visit: Payer: Self-pay | Admitting: Cardiology

## 2018-12-03 DIAGNOSIS — I10 Essential (primary) hypertension: Secondary | ICD-10-CM

## 2018-12-03 NOTE — Telephone Encounter (Signed)
Rx(s) sent to pharmacy electronically.  

## 2018-12-04 NOTE — Telephone Encounter (Signed)
Rx(s) sent to pharmacy electronically.  

## 2019-01-01 ENCOUNTER — Other Ambulatory Visit: Payer: Self-pay | Admitting: Cardiology

## 2019-01-29 ENCOUNTER — Other Ambulatory Visit: Payer: Self-pay | Admitting: Cardiology

## 2019-02-20 ENCOUNTER — Other Ambulatory Visit: Payer: Self-pay

## 2019-02-20 DIAGNOSIS — I10 Essential (primary) hypertension: Secondary | ICD-10-CM

## 2019-02-20 MED ORDER — LOSARTAN POTASSIUM 100 MG PO TABS: 100.0000 mg | ORAL_TABLET | Freq: Every day | ORAL | 0 refills | Status: DC

## 2019-02-20 MED ORDER — AMLODIPINE BESYLATE 5 MG PO TABS: 5.0000 mg | ORAL_TABLET | Freq: Every day | ORAL | 0 refills | Status: DC

## 2019-03-14 ENCOUNTER — Other Ambulatory Visit: Payer: Self-pay

## 2019-03-14 DIAGNOSIS — I10 Essential (primary) hypertension: Secondary | ICD-10-CM

## 2019-03-18 ENCOUNTER — Telehealth: Payer: Self-pay | Admitting: Cardiology

## 2019-03-18 DIAGNOSIS — I10 Essential (primary) hypertension: Secondary | ICD-10-CM

## 2019-03-18 MED ORDER — AMLODIPINE BESYLATE 5 MG PO TABS: 5.0000 mg | ORAL_TABLET | Freq: Every day | ORAL | 0 refills | Status: DC

## 2019-03-18 MED ORDER — LOSARTAN POTASSIUM 100 MG PO TABS: 100.0000 mg | ORAL_TABLET | Freq: Every day | ORAL | 0 refills | Status: DC

## 2019-03-18 NOTE — Telephone Encounter (Signed)
Medications sent for 15 day supply as patient is over due for appointment. Pharmacy notified that patient needs appointment for further refills.

## 2019-03-18 NOTE — Telephone Encounter (Signed)
New message   Pt c/o medication issue:  1. Name of Medication:amLODipine (NORVASC) 5 MG tablet   losartan (COZAAR) 100 MG tablet  2. How are you currently taking this medication (dosage and times per day)? As written  3. Are you having a reaction (difficulty breathing--STAT)? No   4. What is your medication issue? Patient needs new prescription sent to [facility] 90 day supply

## 2019-04-12 ENCOUNTER — Other Ambulatory Visit: Payer: Self-pay

## 2019-04-12 ENCOUNTER — Telehealth: Payer: Self-pay | Admitting: Cardiology

## 2019-04-12 DIAGNOSIS — I10 Essential (primary) hypertension: Secondary | ICD-10-CM

## 2019-04-12 MED ORDER — AMLODIPINE BESYLATE 5 MG PO TABS: 5.0000 mg | ORAL_TABLET | Freq: Every day | ORAL | 1 refills | Status: DC

## 2019-04-12 MED ORDER — LOSARTAN POTASSIUM 100 MG PO TABS: 100.0000 mg | ORAL_TABLET | Freq: Every day | ORAL | 1 refills | Status: DC

## 2019-04-12 NOTE — Telephone Encounter (Signed)
New Message  *STAT* If patient is at the pharmacy, call can be transferred to refill team.   1. Which medications need to be refilled? (please list name of each medication and dose if known)  losartan (COZAAR) 100 MG tablet amLODipine (NORVASC) 5 MG tablet  2. Which pharmacy/location (including street and city if local pharmacy) is medication to be sent to?  [place], [place] - [address] AT [place]  3. Do they need a 30 day or 90 day supply? 90 day

## 2019-05-06 ENCOUNTER — Other Ambulatory Visit: Payer: Self-pay

## 2019-05-06 ENCOUNTER — Encounter (HOSPITAL_COMMUNITY): Payer: Self-pay | Admitting: Emergency Medicine

## 2019-05-06 ENCOUNTER — Emergency Department (HOSPITAL_COMMUNITY): Payer: Self-pay

## 2019-05-06 ENCOUNTER — Emergency Department (HOSPITAL_COMMUNITY)
Admission: EM | Admit: 2019-05-06 | Discharge: 2019-05-06 | Disposition: A | Discharge status: Home / Self Care | Payer: Self-pay | Attending: Emergency Medicine | Admitting: Emergency Medicine

## 2019-05-06 DIAGNOSIS — Z20822 Contact with and (suspected) exposure to covid-19: Secondary | ICD-10-CM

## 2019-05-06 DIAGNOSIS — Z20828 Contact with and (suspected) exposure to other viral communicable diseases: Secondary | ICD-10-CM | POA: Insufficient documentation

## 2019-05-06 DIAGNOSIS — R0789 Other chest pain: Secondary | ICD-10-CM | POA: Insufficient documentation

## 2019-05-06 DIAGNOSIS — I10 Essential (primary) hypertension: Secondary | ICD-10-CM | POA: Insufficient documentation

## 2019-05-06 LAB — CBC
HCT: 42.3 % (ref 39.0–52.0)
Hemoglobin: 14.5 g/dL (ref 13.0–17.0)
MCH: 32.4 pg (ref 26.0–34.0)
MCHC: 34.3 g/dL (ref 30.0–36.0)
MCV: 94.6 fL (ref 80.0–100.0)
Platelets: 334 10*3/uL (ref 150–400)
RBC: 4.47 MIL/uL (ref 4.22–5.81)
RDW: 12.7 % (ref 11.5–15.5)
WBC: 3.7 10*3/uL — ABNORMAL LOW (ref 4.0–10.5)
nRBC: 0 % (ref 0.0–0.2)

## 2019-05-06 LAB — BASIC METABOLIC PANEL
Anion gap: 9 (ref 5–15)
BUN: 14 mg/dL (ref 6–20)
CO2: 22 mmol/L (ref 22–32)
Calcium: 9 mg/dL (ref 8.9–10.3)
Chloride: 106 mmol/L (ref 98–111)
Creatinine, Ser: 1.24 mg/dL (ref 0.61–1.24)
GFR calc Af Amer: >60 mL/min (ref >60)
GFR calc non Af Amer: >60 mL/min (ref >60)
Glucose, Bld: 103 mg/dL — ABNORMAL HIGH (ref 70–99)
Potassium: 4.2 mmol/L (ref 3.5–5.1)
Sodium: 137 mmol/L (ref 135–145)

## 2019-05-06 LAB — D-DIMER, QUANTITATIVE: D-Dimer, Quant: 0.44 ug{FEU}/mL (ref 0.00–0.50)

## 2019-05-06 LAB — TROPONIN I (HIGH SENSITIVITY)
Troponin I (High Sensitivity): 4 ng/L
Troponin I (High Sensitivity): 3 ng/L

## 2019-05-06 LAB — POC SARS CORONAVIRUS 2 AG -  ED: SARS Coronavirus 2 Ag: NEGATIVE

## 2019-05-06 MED ORDER — SODIUM CHLORIDE 0.9 % IV BOLUS
1000.0000 mL | Freq: Once | INTRAVENOUS | Status: AC
  Administered 2019-05-06: 1000 mL via INTRAVENOUS

## 2019-05-06 MED ORDER — MORPHINE SULFATE (PF) 4 MG/ML IV SOLN
4.0000 mg | Freq: Once | INTRAVENOUS | Status: DC
  Filled 2019-05-06: qty 1

## 2019-05-06 MED ORDER — ONDANSETRON 4 MG PO TBDP: 4.0000 mg | ORAL_TABLET | Freq: Once | ORAL | Status: DC

## 2019-05-06 MED ORDER — SODIUM CHLORIDE 0.9% FLUSH: 3.0000 mL | Freq: Once | INTRAVENOUS | Status: DC

## 2019-05-06 NOTE — ED Provider Notes (Signed)
[facility] EMERGENCY DEPARTMENT Provider Note   CSN: [id number] Arrival date & time: 05/06/19  1304     History   Chief Complaint Chief Complaint  Patient presents with  . Chest Pain  . Weakness    HPI Joseph Adkins is a 53 y.o. male.     The history is provided by the patient and medical records. No language interpreter was used.  Chest Pain Associated symptoms: weakness   Weakness Associated symptoms: chest pain      53 year old male with history of hypertension presenting complaining of chest pain.  Patient report having intermittent left-sided chest pain within this past week.  First episode was approximately a week ago while he was making an order at his workplace.  Described as a twisting sensation to his left chest like someone turning a corkscrew.  Pain does not radiate, no associated shortness of breath, weakness, diaphoresis, nausea.  Pain was nonexertional.  Throughout the week he report intermittent episodes where his legs felt weak bilaterally.  Today he report 1 other similar chest discomfort lasting for short while, prompting this ER visit.  Currently states pain is 4 out of 10 without any specific treatment tried.  No associated fever or chills no cough no runny nose sneezing sore throat loss of taste or smell nausea vomiting diarrhea or focal weakness or focal numbness.  He denies tobacco use but admits to social alcohol use.  He report having a cardiac stress test 2 years ago that was unremarkable.  Cardiologist Dr. Stanford Breed.  He does have an appointment with his cardiologist on January 24.  He denies any recent sick contact.  Past Medical History:  Diagnosis Date  . Hypertension     Patient Active Problem List   Diagnosis Date Noted  . Near syncope 01/05/2017    History reviewed. No pertinent surgical history.      Home Medications    Prior to Admission medications   Medication Sig Start Date End Date Taking? Authorizing  Provider  amLODipine (NORVASC) 5 MG tablet Take 1 tablet (5 mg total) by mouth daily. <PLEASE MAKE APPOINTMENT FOR FUTURE REFILLS> 04/12/19  Yes Crenshaw, Denice Bors, MD  Flaxseed, Linseed, (FLAX SEED OIL) 1000 MG CAPS Take 1,000 mg by mouth daily.   Yes [provider]  losartan (COZAAR) 100 MG tablet Take 1 tablet (100 mg total) by mouth daily. *PLEASE MAKE APPOINTMENT FOR FUTURE REFILLS* 04/12/19  Yes Lelon Perla, MD  atenolol (TENORMIN) 50 MG tablet Take 1 tablet (50 mg total) by mouth daily. OFFICE VISIT NEEDED BEFORE ADDITIONAL REFILLS Patient not taking: Reported on 05/06/2019 11/12/18   Lelon Perla, MD    Family History Family History  Problem Relation Age of Onset  . Diabetes Mother   . Diabetes Maternal Grandmother   . Diabetes Maternal Grandfather   . Hypertension Maternal Grandfather   . Hyperlipidemia Maternal Grandfather   . Diabetes Paternal Grandmother   . Diabetes Paternal Grandfather   . Hypertension Paternal Grandfather     Social History Social History   Tobacco Use  . Smoking status: Never Smoker  . Smokeless tobacco: Never Used  Substance Use Topics  . Alcohol use: Yes    Alcohol/week: 0.0 standard drinks    Comment: 16-20 beers per week  . Drug use: No     Allergies   Patient has no known allergies.   Review of Systems Review of Systems  Cardiovascular: Positive for chest pain.  Neurological: Positive for weakness.  All other systems reviewed and are negative.    Physical Exam Updated Vital Signs BP (!) 142/98   Pulse 97   Temp 98.8 F (37.1 C) (Oral)   Resp 12   Ht 5\' 6"  (1.676 m)   Wt 78 kg   SpO2 100%   BMI 27.76 kg/m   Physical Exam Vitals signs and nursing note reviewed.  Constitutional:      General: He is not in acute distress.    Appearance: He is well-developed.  HENT:     Head: Atraumatic.  Eyes:     Conjunctiva/sclera: Conjunctivae normal.  Neck:     Musculoskeletal: Neck supple.  Cardiovascular:      Rate and Rhythm: Tachycardia present.     Heart sounds: No murmur. No friction rub. No gallop.   Pulmonary:     Effort: Pulmonary effort is normal.     Breath sounds: Normal breath sounds. No wheezing or rhonchi.  Abdominal:     Palpations: Abdomen is soft.     Tenderness: There is no abdominal tenderness.  Musculoskeletal:     Comments: 5 out of 5 strength to bilateral lower extremities with intact distal pedal pulses.  Skin:    Capillary Refill: Capillary refill takes less than 2 seconds.     Findings: No rash.  Neurological:     Mental Status: He is alert and oriented to person, place, and time.  Psychiatric:        Mood and Affect: Mood normal.      ED Treatments / Results  Labs (all labs ordered are listed, but only abnormal results are displayed) Labs Reviewed  BASIC METABOLIC PANEL - Abnormal; Notable for the following components:      Result Value   Glucose, Bld 103 (*)    All other components within normal limits  CBC - Abnormal; Notable for the following components:   WBC 3.7 (*)    All other components within normal limits  SARS CORONAVIRUS 2 (TAT 6-24 HRS)  D-DIMER, QUANTITATIVE (NOT AT [facility])  POC SARS CORONAVIRUS 2 AG -  ED  TROPONIN I (HIGH SENSITIVITY)  TROPONIN I (HIGH SENSITIVITY)    EKG EKG Interpretation  Date/Time:  Monday May 06 2019 13:20:59 EST Ventricular Rate:  118 PR Interval:  134 QRS Duration: 84 QT Interval:  318 QTC Calculation: 445 R Axis:   -31 Text Interpretation: Sinus tachycardia Possible Left atrial enlargement Left axis deviation Septal infarct , age undetermined T wave abnormality, consider lateral ischemia Abnormal ECG increased rate compared to prior on Nov 05 2018 Confirmed by Quintella Reichert [id number]) on 05/06/2019 5:43:40 PM   Radiology Dg Chest 2 View  Result Date: 05/06/2019 CLINICAL DATA:  Chest pain for 1 week EXAM: CHEST - 2 VIEW COMPARISON:  Multiple priors, most recently 04/27/2017 FINDINGS: Calcified nodule  in the right cardiophrenic sulcus. Stable since 2018, may reflect a calcified lymph node or atypical calcification at the costochondral junction. No consolidation, features of edema, pneumothorax, or effusion. Pulmonary vascularity is normally distributed. The cardiomediastinal contours are unremarkable. No acute osseous or soft tissue abnormality. IMPRESSION: No acute cardiopulmonary abnormality. Electronically Signed   By: Lovena Le M.D.   On: 05/06/2019 14:17    Procedures Procedures (including critical care time)  Medications Ordered in ED Medications  sodium chloride flush (NS) 0.9 % injection 3 mL (3 mLs Intravenous Not Given 05/06/19 1837)  morphine 4 MG/ML injection 4 mg (0 mg Intravenous Hold 05/06/19 1844)  sodium chloride 0.9 % bolus 1,000  mL (1,000 mLs Intravenous New Bag/Given 05/06/19 1845)     Initial Impression / Assessment and Plan / ED Course  I have reviewed the triage vital signs and the nursing notes.  Pertinent labs & imaging results that were available during my care of the patient were reviewed by me and considered in my medical decision making (see chart for details).        BP (!) 142/98   Pulse 97   Temp 98.8 F (37.1 C) (Oral)   Resp 12   Ht 5\' 6"  (1.676 m)   Wt 78 kg   SpO2 100%   BMI 27.76 kg/m    Final Clinical Impressions(s) / ED Diagnoses   Final diagnoses:  Atypical chest pain  Suspected COVID-19 virus infection    ED Discharge Orders    None     8:13 PM Patient here with intermittent left-sided chest pain.  Pain is atypical of ACS.  He initially found to be tachycardic and hypertensive.  States he has been compliant with his blood pressure medication.  8:16 PM On recheck, blood pressure is 142/98.  Negative delta troponin, normal D-dimer, normal COVID-19 testing, labs are reassuring and chest x-ray unremarkable.  In the setting and patient feeling fine, I encourage patient to follow-up closely with his cardiologist in the next few  days for recheck.  Return precaution discussed.  Care discussed with Dr. Ralene Bathe.   Domenic Moras, PA-C 05/06/19 2021    Quintella Reichert, MD 05/09/19 [phone]

## 2019-05-06 NOTE — Discharge Instructions (Signed)
You have been evaluated for your chest pain.  No concerning finding were noted on today's exam.  Call and follow up with your cardiologist for further care.  A covid test was obtained today.  If positive you will be notify in the next 2-3 days.

## 2019-05-06 NOTE — ED Triage Notes (Signed)
Pt states cp x1 week, states that he has had chest pain in the past and he is supposed to see his cardiologist 1/24 but states today while at work he got very weak and felt like he may pass out. Denies cough, sob, fever, chills, recent sick contacts. A/ox4, resp e/u, nad.

## 2019-05-07 ENCOUNTER — Telehealth: Payer: Self-pay | Admitting: *Deleted

## 2019-05-07 LAB — SARS CORONAVIRUS 2 (TAT 6-24 HRS): SARS Coronavirus 2: NEGATIVE

## 2019-05-07 NOTE — Telephone Encounter (Signed)
Per message from  Dr Donah Driver, MD  Alvina Filbert B, LPN        Can follow up with APP or me virtually on Friday.    Left message to call back

## 2019-05-17 NOTE — Telephone Encounter (Signed)
Left message to call back  

## 2019-06-18 NOTE — Progress Notes (Deleted)
HPI: FU near syncopeand abnormal ECG.Patient was admitted August 2018 with near syncopal episodes associated with headaches. Carotid Dopplers August 2018 showed 1-39% bilateral stenosis. Echocardiogram August 2018 showed vigorous LV systolic function, grade 1 diastolic dysfunction. Head CT unremarkable.  Monitor October 2018 showed sinus to sinus tachycardia.  Nuclear study December 2018 showed ejection fraction 47% but visually appeared better.  Perfusion normal.  Patient seen in the emergency room May 06, 2019 with atypical chest pain.  Troponins normal.  D-dimer negative.  Chest x-ray negative.  Since last seen   Current Outpatient Medications  Medication Sig Dispense Refill  . amLODipine (NORVASC) 5 MG tablet Take 1 tablet (5 mg total) by mouth daily. <PLEASE MAKE APPOINTMENT FOR FUTURE REFILLS> 30 tablet 1  . atenolol (TENORMIN) 50 MG tablet Take 1 tablet (50 mg total) by mouth daily. OFFICE VISIT NEEDED BEFORE ADDITIONAL REFILLS (Patient not taking: Reported on 05/06/2019) 45 tablet 0  . Flaxseed, Linseed, (FLAX SEED OIL) 1000 MG CAPS Take 1,000 mg by mouth daily.    Marland Kitchen losartan (COZAAR) 100 MG tablet Take 1 tablet (100 mg total) by mouth daily. *PLEASE MAKE APPOINTMENT FOR FUTURE REFILLS* 30 tablet 1   No current facility-administered medications for this visit.     Past Medical History:  Diagnosis Date  . Hypertension     No past surgical history on file.  Social History   Socioeconomic History  . Marital status: Single    Spouse name: Not on file  . Number of children: Not on file  . Years of education: Not on file  . Highest education level: Not on file  Occupational History  . Occupation: Teacher, adult education: [employer]  Tobacco Use  . Smoking status: Never Smoker  . Smokeless tobacco: Never Used  Substance and Sexual Activity  . Alcohol use: Yes    Alcohol/week: 0.0 standard drinks    Comment: 16-20 beers per week  . Drug use: No  .  Sexual activity: Not on file  Other Topics Concern  . Not on file  Social History Narrative  . Not on file   Social Determinants of Health   Financial Resource Strain:   . Difficulty of Paying Living Expenses: Not on file  Food Insecurity:   . Worried About Charity fundraiser in the Last Year: Not on file  . Ran Out of Food in the Last Year: Not on file  Transportation Needs:   . Lack of Transportation (Medical): Not on file  . Lack of Transportation (Non-Medical): Not on file  Physical Activity:   . Days of Exercise per Week: Not on file  . Minutes of Exercise per Session: Not on file  Stress:   . Feeling of Stress : Not on file  Social Connections:   . Frequency of Communication with Friends and Family: Not on file  . Frequency of Social Gatherings with Friends and Family: Not on file  . Attends Religious Services: Not on file  . Active Member of Clubs or Organizations: Not on file  . Attends Archivist Meetings: Not on file  . Marital Status: Not on file  Intimate Partner Violence:   . Fear of Current or Ex-Partner: Not on file  . Emotionally Abused: Not on file  . Physically Abused: Not on file  . Sexually Abused: Not on file    Family History  Problem Relation Age of Onset  . Diabetes Mother   . Diabetes Maternal  Grandmother   . Diabetes Maternal Grandfather   . Hypertension Maternal Grandfather   . Hyperlipidemia Maternal Grandfather   . Diabetes Paternal Grandmother   . Diabetes Paternal Grandfather   . Hypertension Paternal Grandfather     ROS: no fevers or chills, productive cough, hemoptysis, dysphasia, odynophagia, melena, hematochezia, dysuria, hematuria, rash, seizure activity, orthopnea, PND, pedal edema, claudication. Remaining systems are negative.  Physical Exam: Well-developed well-nourished in no acute distress.  Skin is warm and dry.  HEENT is normal.  Neck is supple.  Chest is clear to auscultation with normal expansion.    Cardiovascular exam is regular rate and rhythm.  Abdominal exam nontender or distended. No masses palpated. Extremities show no edema. neuro grossly intact  ECG-May 06, 2019-sinus tachycardia, cannot rule out prior septal infarct, lateral T wave inversion.  Personally reviewed  A/P  1 chest pain-symptoms atypical.  Previous nuclear study showed no ischemia.  We will arrange cardiac CTA to further assess.  2 hypertension-blood pressure controlled.  Continue present medications and follow.  3 history of near syncope-no recurrent episodes.  LV function normal.  Kirk Ruths, MD

## 2019-06-20 ENCOUNTER — Ambulatory Visit: Payer: Self-pay | Admitting: Cardiology

## 2019-06-26 NOTE — Progress Notes (Signed)
HPI: FU near syncopeand abnormal ECG.Patient was admitted August 2018 with near syncopal episodes associated with headaches. Carotid Dopplers August 2018 showed 1-39% bilateral stenosis. Echocardiogram August 2018 showed vigorous LV systolic function, grade 1 diastolic dysfunction. Head CT unremarkable. Monitor October 2018 showed sinus to sinus tachycardia. Nuclear study December 2018 showed ejection fraction 47% but visually appeared better. Perfusion normal.  Patient seen in the emergency room December 7 with atypical chest pain and dizzy spell. Chest x-ray negative. D-dimer and troponins negative.  Hemoglobin 14.5.  Patient states the episode occurred at work.  He was lightheaded for about 10 minutes and had a "corkscrew" feeling in his chest.  The pain did not radiate nor was it positional.  It lasted 2 weeks continuously.  He was seen in the emergency room and his symptoms improved with IV hydration.  Since last seen he denies increased dyspnea on exertion, orthopnea, PND, pedal edema, syncope or exertional chest pain.  Current Outpatient Medications  Medication Sig Dispense Refill  . amLODipine (NORVASC) 5 MG tablet Take 1 tablet (5 mg total) by mouth daily. 7 tablet 0  . Flaxseed, Linseed, (FLAX SEED OIL) 1000 MG CAPS Take 1,000 mg by mouth daily.    Marland Kitchen losartan (COZAAR) 100 MG tablet Take 1 tablet (100 mg total) by mouth daily. 7 tablet 0   No current facility-administered medications for this visit.     Past Medical History:  Diagnosis Date  . Hypertension     History reviewed. No pertinent surgical history.  Social History   Socioeconomic History  . Marital status: Single    Spouse name: Not on file  . Number of children: Not on file  . Years of education: Not on file  . Highest education level: Not on file  Occupational History  . Occupation: Teacher, adult education: [employer]  Tobacco Use  . Smoking status: Never Smoker  . Smokeless tobacco: Never  Used  Substance and Sexual Activity  . Alcohol use: Yes    Alcohol/week: 0.0 standard drinks    Comment: 16-20 beers per week  . Drug use: No  . Sexual activity: Not on file  Other Topics Concern  . Not on file  Social History Narrative  . Not on file   Social Determinants of Health   Financial Resource Strain:   . Difficulty of Paying Living Expenses: Not on file  Food Insecurity:   . Worried About Charity fundraiser in the Last Year: Not on file  . Ran Out of Food in the Last Year: Not on file  Transportation Needs:   . Lack of Transportation (Medical): Not on file  . Lack of Transportation (Non-Medical): Not on file  Physical Activity:   . Days of Exercise per Week: Not on file  . Minutes of Exercise per Session: Not on file  Stress:   . Feeling of Stress : Not on file  Social Connections:   . Frequency of Communication with Friends and Family: Not on file  . Frequency of Social Gatherings with Friends and Family: Not on file  . Attends Religious Services: Not on file  . Active Member of Clubs or Organizations: Not on file  . Attends Archivist Meetings: Not on file  . Marital Status: Not on file  Intimate Partner Violence:   . Fear of Current or Ex-Partner: Not on file  . Emotionally Abused: Not on file  . Physically Abused: Not on file  .  Sexually Abused: Not on file    Family History  Problem Relation Age of Onset  . Diabetes Mother   . Diabetes Maternal Grandmother   . Diabetes Maternal Grandfather   . Hypertension Maternal Grandfather   . Hyperlipidemia Maternal Grandfather   . Diabetes Paternal Grandmother   . Diabetes Paternal Grandfather   . Hypertension Paternal Grandfather     ROS: no fevers or chills, productive cough, hemoptysis, dysphasia, odynophagia, melena, hematochezia, dysuria, hematuria, rash, seizure activity, orthopnea, PND, pedal edema, claudication. Remaining systems are negative.  Physical Exam: Well-developed  well-nourished in no acute distress.  Skin is warm and dry.  HEENT is normal.  Neck is supple.  Chest is clear to auscultation with normal expansion.  Cardiovascular exam is regular rate and rhythm.  Abdominal exam nontender or distended. No masses palpated. Extremities show no edema. neuro grossly intact  ECG-May 06, 2019-sinus tachycardia, RV conduction delay, cannot rule out septal infarct, lateral T wave inversion.  Today's electrocardiogram shows sinus rhythm at a rate of 86, RV conduction delay and lateral T wave inversion unchanged.  Personally reviewed  A/P  1 chest pain-symptoms atypical.  Electrocardiogram without new ST changes.  Previous nuclear study negative.  Would not pursue further ischemia evaluation at this point.  2 hypertension-patient's blood pressure is elevated; however he has not taken his medications this morning.  I have asked him to follow this and we will increase medications as needed.  3 history of near syncope-previous cardiac evaluation unremarkable.  Recurrent episode in December lasting 10 minutes.  Etiology unclear.  Will follow for now.  Kirk Ruths, MD

## 2019-06-28 ENCOUNTER — Other Ambulatory Visit: Payer: Self-pay | Admitting: Cardiology

## 2019-06-28 DIAGNOSIS — I10 Essential (primary) hypertension: Secondary | ICD-10-CM

## 2019-06-28 MED ORDER — AMLODIPINE BESYLATE 5 MG PO TABS: 5.0000 mg | ORAL_TABLET | Freq: Every day | ORAL | 0 refills | Status: DC

## 2019-06-28 MED ORDER — LOSARTAN POTASSIUM 100 MG PO TABS: 100.0000 mg | ORAL_TABLET | Freq: Every day | ORAL | 0 refills | Status: DC

## 2019-06-28 NOTE — Telephone Encounter (Signed)
Discussed refill procedure with pt and he states that he will be at his scheduled appt 07-02-19

## 2019-06-28 NOTE — Telephone Encounter (Signed)
*  STAT* If patient is at the pharmacy, call can be transferred to refill team.   1. Which medications need to be refilled? (please list name of each medication and dose if known) losartan (COZAAR) 100 MG tablet / amLODipine (NORVASC) 5 MG tablet  2. Which pharmacy/location (including street and city if local pharmacy) is medication to be sent to? [place], [place] - [address] AT [place]  3. Do they need a 30 day or 90 day supply? 90  Patient has an appt with Dr. Stanford Breed on 07/02/19

## 2019-07-02 ENCOUNTER — Encounter: Payer: Self-pay | Admitting: Cardiology

## 2019-07-02 ENCOUNTER — Ambulatory Visit (INDEPENDENT_AMBULATORY_CARE_PROVIDER_SITE_OTHER): Payer: Self-pay | Admitting: Cardiology

## 2019-07-02 ENCOUNTER — Other Ambulatory Visit: Payer: Self-pay

## 2019-07-02 VITALS — BP 146/92 | HR 84 | Temp 96.3°F | Ht 66.0 in | Wt 166.0 lb

## 2019-07-02 DIAGNOSIS — I10 Essential (primary) hypertension: Secondary | ICD-10-CM

## 2019-07-02 DIAGNOSIS — R072 Precordial pain: Secondary | ICD-10-CM

## 2019-07-02 DIAGNOSIS — R55 Syncope and collapse: Secondary | ICD-10-CM

## 2019-07-02 NOTE — Patient Instructions (Signed)

## 2019-07-09 ENCOUNTER — Other Ambulatory Visit: Payer: Self-pay | Admitting: Cardiology

## 2019-07-09 DIAGNOSIS — I10 Essential (primary) hypertension: Secondary | ICD-10-CM

## 2019-07-09 MED ORDER — AMLODIPINE BESYLATE 5 MG PO TABS: 5.0000 mg | ORAL_TABLET | Freq: Every day | ORAL | 3 refills | Status: DC

## 2019-07-09 MED ORDER — LOSARTAN POTASSIUM 100 MG PO TABS: 100.0000 mg | ORAL_TABLET | Freq: Every day | ORAL | 3 refills | Status: DC

## 2019-07-09 NOTE — Telephone Encounter (Signed)
*  STAT* If patient is at the pharmacy, call can be transferred to refill team.   1. Which medications need to be refilled? (please list name of each medication and dose if known)  losartan (COZAAR) 100 MG tablet amLODipine (NORVASC) 5 MG tablet  2. Which pharmacy/location (including street and city if local pharmacy) is medication to be sent to? [place], [place] - [address] AT [place]  3. Do they need a 30 day or 90 day supply? 90 day supply  Patient is currently out of medication.

## 2019-07-09 NOTE — Telephone Encounter (Signed)
Rx has been sent to the pharmacy electronically. ° °

## 2019-09-12 ENCOUNTER — Ambulatory Visit: Payer: Self-pay | Attending: Internal Medicine

## 2019-09-12 DIAGNOSIS — Z23 Encounter for immunization: Secondary | ICD-10-CM

## 2019-09-12 NOTE — Progress Notes (Signed)
   Covid-19 Vaccination Clinic  Name:  Joseph Adkins    MRN: [id number] DOB: December 13, 1965  09/12/2019  [name] was observed post Covid-19 immunization for 15 minutes without incident. He was provided with Vaccine Information Sheet and instruction to access the V-Safe system.   [name] was instructed to call 911 with any severe reactions post vaccine: Marland Kitchen Difficulty breathing  . Swelling of face and throat  . A fast heartbeat  . A bad rash all over body  . Dizziness and weakness   Immunizations Administered    Name Date Dose VIS Date Route   Pfizer COVID-19 Vaccine 09/12/2019 12:53 PM 0.3 mL 05/10/2019 Intramuscular   Manufacturer: [place]   Lot: [id number]   [place]: [id number]

## 2019-09-26 NOTE — Telephone Encounter (Signed)
Patient has been seen.

## 2019-10-07 ENCOUNTER — Ambulatory Visit: Payer: Self-pay | Attending: Internal Medicine

## 2019-10-07 DIAGNOSIS — Z23 Encounter for immunization: Secondary | ICD-10-CM

## 2019-10-07 NOTE — Progress Notes (Signed)
   Covid-19 Vaccination Clinic  Name:  Joseph Adkins    MRN: [id number] DOB: 12/09/1965  10/07/2019  [name] was observed post Covid-19 immunization for 15 minutes without incident. He was provided with Vaccine Information Sheet and instruction to access the V-Safe system.   [name] was instructed to call 911 with any severe reactions post vaccine: Marland Kitchen Difficulty breathing  . Swelling of face and throat  . A fast heartbeat  . A bad rash all over body  . Dizziness and weakness   Immunizations Administered    Name Date Dose VIS Date Route   Pfizer COVID-19 Vaccine 10/07/2019  8:09 AM 0.3 mL 07/24/2018 Intramuscular   Manufacturer: [place]   Lot: [id number]   [place]: [id number]

## 2019-10-24 ENCOUNTER — Emergency Department (HOSPITAL_COMMUNITY): Payer: PRIVATE HEALTH INSURANCE

## 2019-10-24 ENCOUNTER — Emergency Department (HOSPITAL_COMMUNITY)
Admission: EM | Admit: 2019-10-24 | Discharge: 2019-10-24 | Disposition: A | Discharge status: Home / Self Care | Payer: PRIVATE HEALTH INSURANCE | Attending: Emergency Medicine | Admitting: Emergency Medicine

## 2019-10-24 ENCOUNTER — Other Ambulatory Visit: Payer: Self-pay

## 2019-10-24 DIAGNOSIS — I10 Essential (primary) hypertension: Secondary | ICD-10-CM | POA: Diagnosis not present

## 2019-10-24 DIAGNOSIS — R079 Chest pain, unspecified: Secondary | ICD-10-CM | POA: Diagnosis not present

## 2019-10-24 LAB — BASIC METABOLIC PANEL
Anion gap: 10 (ref 5–15)
BUN: 12 mg/dL (ref 6–20)
CO2: 27 mmol/L (ref 22–32)
Calcium: 9.2 mg/dL (ref 8.9–10.3)
Chloride: 102 mmol/L (ref 98–111)
Creatinine, Ser: 1.12 mg/dL (ref 0.61–1.24)
GFR calc Af Amer: >60 mL/min (ref >60)
GFR calc non Af Amer: >60 mL/min (ref >60)
Glucose, Bld: 114 mg/dL — ABNORMAL HIGH (ref 70–99)
Potassium: 4.3 mmol/L (ref 3.5–5.1)
Sodium: 139 mmol/L (ref 135–145)

## 2019-10-24 LAB — TROPONIN I (HIGH SENSITIVITY)
Troponin I (High Sensitivity): 5 ng/L (ref <18)
Troponin I (High Sensitivity): 5 ng/L (ref <18)

## 2019-10-24 LAB — CBC
HCT: 39.1 % (ref 39.0–52.0)
Hemoglobin: 13.5 g/dL (ref 13.0–17.0)
MCH: 32.7 pg (ref 26.0–34.0)
MCHC: 34.5 g/dL (ref 30.0–36.0)
MCV: 94.7 fL (ref 80.0–100.0)
Platelets: 301 10*3/uL (ref 150–400)
RBC: 4.13 MIL/uL — ABNORMAL LOW (ref 4.22–5.81)
RDW: 12.4 % (ref 11.5–15.5)
WBC: 5 10*3/uL (ref 4.0–10.5)
nRBC: 0 % (ref 0.0–0.2)

## 2019-10-24 MED ORDER — SODIUM CHLORIDE 0.9% FLUSH: 3.0000 mL | Freq: Once | INTRAVENOUS | Status: DC

## 2019-10-24 NOTE — ED Provider Notes (Signed)
[facility] Emergency Department Provider Note MRN:  [id number]  Arrival date & time: 10/24/19     Chief Complaint   Chest Pain   History of Present Illness   Joseph Adkins is a 54 y.o. year-old male with a history of hypertension presenting to the ED with chief complaint of chest pain.  Location: Left chest and left shoulder Duration: 3 years but more frequent episodes over the past few days Onset: Sudden Timing: Intermittent Description: Sharp Severity: 4-10 Exacerbating/Alleviating Factors: None Associated Symptoms: Near syncope Pertinent Negatives: no diaphoresis, no nausea, no vomiting, no shortness of breath, no leg pain or swelling   Review of Systems  A complete 10 system review of systems was obtained and all systems are negative except as noted in the HPI and PMH.   Patient's Health History    Past Medical History:  Diagnosis Date  . Hypertension     No past surgical history on file.  Family History  Problem Relation Age of Onset  . Diabetes Mother   . Diabetes Maternal Grandmother   . Diabetes Maternal Grandfather   . Hypertension Maternal Grandfather   . Hyperlipidemia Maternal Grandfather   . Diabetes Paternal Grandmother   . Diabetes Paternal Grandfather   . Hypertension Paternal Grandfather     Social History   Socioeconomic History  . Marital status: Single    Spouse name: Not on file  . Number of children: Not on file  . Years of education: Not on file  . Highest education level: Not on file  Occupational History  . Occupation: Teacher, adult education: [employer]  Tobacco Use  . Smoking status: Never Smoker  . Smokeless tobacco: Never Used  Substance and Sexual Activity  . Alcohol use: Yes    Alcohol/week: 0.0 standard drinks    Comment: 16-20 beers per week  . Drug use: No  . Sexual activity: Not on file  Other Topics Concern  . Not on file  Social History Narrative  . Not on file   Social  Determinants of Health   Financial Resource Strain:   . Difficulty of Paying Living Expenses:   Food Insecurity:   . Worried About Charity fundraiser in the Last Year:   . Arboriculturist in the Last Year:   Transportation Needs:   . Film/video editor (Medical):   Marland Kitchen Lack of Transportation (Non-Medical):   Physical Activity:   . Days of Exercise per Week:   . Minutes of Exercise per Session:   Stress:   . Feeling of Stress :   Social Connections:   . Frequency of Communication with Friends and Family:   . Frequency of Social Gatherings with Friends and Family:   . Attends Religious Services:   . Active Member of Clubs or Organizations:   . Attends Archivist Meetings:   Marland Kitchen Marital Status:   Intimate Partner Violence:   . Fear of Current or Ex-Partner:   . Emotionally Abused:   Marland Kitchen Physically Abused:   . Sexually Abused:      Physical Exam   Vitals:   10/24/19 2000 10/24/19 2015  BP: (!) 159/94 (!) 158/99  Pulse: 84 87  Resp: 15 15  Temp:    SpO2: 100% 100%    CONSTITUTIONAL: Well-appearing, NAD NEURO:  Alert and oriented x 3, no focal deficits EYES:  eyes equal and reactive ENT/NECK:  no LAD, no JVD CARDIO: Regular rate, well-perfused, normal S1  and S2 PULM:  CTAB no wheezing or rhonchi GI/GU:  normal bowel sounds, non-distended, non-tender MSK/SPINE:  No gross deformities, no edema SKIN:  no rash, atraumatic PSYCH:  Appropriate speech and behavior  *Additional and/or pertinent findings included in MDM below  Diagnostic and Interventional Summary    EKG Interpretation  Date/Time:  Thursday Oct 24 2019 14:00:51 EDT Ventricular Rate:  107 PR Interval:  142 QRS Duration: 82 QT Interval:  334 QTC Calculation: 445 R Axis:   -7 Text Interpretation: Sinus tachycardia Left ventricular hypertrophy with repolarization abnormality ( R in aVL ) Abnormal ECG Confirmed by Gerlene Fee [id number]) on 10/24/2019 8:29:07 PM      Labs Reviewed  BASIC  METABOLIC PANEL - Abnormal; Notable for the following components:      Result Value   Glucose, Bld 114 (*)    All other components within normal limits  CBC - Abnormal; Notable for the following components:   RBC 4.13 (*)    All other components within normal limits  TROPONIN I (HIGH SENSITIVITY)  TROPONIN I (HIGH SENSITIVITY)    DG Chest 2 View  Final Result      Medications  sodium chloride flush (NS) 0.9 % injection 3 mL (0 mLs Intravenous Hold 10/24/19 2034)     Procedures  /  Critical Care Procedures  ED Course and Medical Decision Making  I have reviewed the triage vital signs, the nursing notes, and pertinent available records from the EMR.  Listed above are laboratory and imaging tests that I personally ordered, reviewed, and interpreted and then considered in my medical decision making (see below for details).      Long history of atypical chest pain, multiple evaluations in the past that were reassuring.  Patient has well-appearing on exam, normal vital signs, no evidence of DVT, doubt PE.  Will screen with troponin, patient has close follow-up already established.  Work-up reassuring, troponin negative, appropriate for discharge.  Barth Kirks. Sedonia Small, MD [place] [email or web address]  Final Clinical Impressions(s) / ED Diagnoses     ICD-10-CM   1. Chest pain, unspecified type  R07.9     ED Discharge Orders    None       Discharge Instructions Discussed with and Provided to Patient:     Discharge Instructions     You were evaluated in the Emergency Department and after careful evaluation, we did not find any emergent condition requiring admission or further testing in the hospital.  Your exam/testing today was overall reassuring.  Your blood test did not show any heart damage today.  Please return to the Emergency Department if you experience any worsening of your condition.  We encourage you to follow up  with a primary care provider.  Thank you for allowing [place] to be a part of your care.        Maudie Flakes, MD 10/24/19 2211

## 2019-10-24 NOTE — ED Triage Notes (Signed)
Pt here for eval of recurrent L sided chest pain, ongoing x 3 years but this episode started last Thursday while playing golf. Sts he had to "catch himself" to keep from passing out. Endorses intermittent shortness of breath and tingling in all extremities. Compliant with Losartan but still hypertensive.

## 2019-10-24 NOTE — Discharge Instructions (Addendum)
You were evaluated in the Emergency Department and after careful evaluation, we did not find any emergent condition requiring admission or further testing in the hospital.  Your exam/testing today was overall reassuring.  Your blood test did not show any heart damage today.  Please return to the Emergency Department if you experience any worsening of your condition.  We encourage you to follow up with a primary care provider.  Thank you for allowing [place] to be a part of your care.

## 2019-10-24 NOTE — ED Notes (Signed)
Patient verbalizes understanding of discharge instructions. Opportunity for questioning and answers were provided. Armband removed by staff, pt discharged from ED ambulatory to home.  

## 2019-11-18 ENCOUNTER — Emergency Department (HOSPITAL_COMMUNITY)
Admission: EM | Admit: 2019-11-18 | Discharge: 2019-11-19 | Disposition: A | Discharge status: Home / Self Care | Payer: No Typology Code available for payment source | Attending: Emergency Medicine | Admitting: Emergency Medicine

## 2019-11-18 DIAGNOSIS — R531 Weakness: Secondary | ICD-10-CM | POA: Insufficient documentation

## 2019-11-18 DIAGNOSIS — Z5321 Procedure and treatment not carried out due to patient leaving prior to being seen by health care provider: Secondary | ICD-10-CM | POA: Insufficient documentation

## 2019-11-18 LAB — BASIC METABOLIC PANEL
Anion gap: 8 (ref 5–15)
BUN: 11 mg/dL (ref 6–20)
CO2: 25 mmol/L (ref 22–32)
Calcium: 8.9 mg/dL (ref 8.9–10.3)
Chloride: 106 mmol/L (ref 98–111)
Creatinine, Ser: 1.19 mg/dL (ref 0.61–1.24)
GFR calc Af Amer: >60 mL/min (ref >60)
GFR calc non Af Amer: >60 mL/min (ref >60)
Glucose, Bld: 105 mg/dL — ABNORMAL HIGH (ref 70–99)
Potassium: 4.1 mmol/L (ref 3.5–5.1)
Sodium: 139 mmol/L (ref 135–145)

## 2019-11-18 LAB — URINALYSIS, ROUTINE W REFLEX MICROSCOPIC
Bilirubin Urine: NEGATIVE
Glucose, UA: NEGATIVE mg/dL
Hgb urine dipstick: NEGATIVE
Ketones, ur: NEGATIVE mg/dL
Leukocytes,Ua: NEGATIVE
Nitrite: NEGATIVE
Protein, ur: NEGATIVE mg/dL
Specific Gravity, Urine: 1.001 — ABNORMAL LOW (ref 1.005–1.030)
pH: 6 (ref 5.0–8.0)

## 2019-11-18 LAB — CBC
HCT: 38.7 % — ABNORMAL LOW (ref 39.0–52.0)
Hemoglobin: 13.1 g/dL (ref 13.0–17.0)
MCH: 32.2 pg (ref 26.0–34.0)
MCHC: 33.9 g/dL (ref 30.0–36.0)
MCV: 95.1 fL (ref 80.0–100.0)
Platelets: 276 10*3/uL (ref 150–400)
RBC: 4.07 MIL/uL — ABNORMAL LOW (ref 4.22–5.81)
RDW: 12.8 % (ref 11.5–15.5)
WBC: 4.9 10*3/uL (ref 4.0–10.5)
nRBC: 0 % (ref 0.0–0.2)

## 2019-11-18 LAB — TROPONIN I (HIGH SENSITIVITY): Troponin I (High Sensitivity): 4 ng/L (ref <18)

## 2019-11-18 MED ORDER — SODIUM CHLORIDE 0.9% FLUSH: 3.0000 mL | Freq: Once | INTRAVENOUS | Status: DC

## 2019-11-18 NOTE — ED Triage Notes (Signed)
Patient complains of general weakness and feeling of fatigue that occurred today. Patient denies syncopal event and has had this feeling all last year and no cause ever determined. Alert and oriented, no pain

## 2019-11-19 LAB — TROPONIN I (HIGH SENSITIVITY): Troponin I (High Sensitivity): <2 ng/L (ref <18)

## 2019-11-19 NOTE — ED Notes (Signed)
Pt called for blood sugar with no response. Pt not visualized in lobby.

## 2020-03-05 ENCOUNTER — Encounter (HOSPITAL_COMMUNITY): Payer: Self-pay | Admitting: *Deleted

## 2020-03-05 ENCOUNTER — Other Ambulatory Visit: Payer: Self-pay

## 2020-03-05 ENCOUNTER — Ambulatory Visit (HOSPITAL_COMMUNITY)
Admission: EM | Admit: 2020-03-05 | Discharge: 2020-03-05 | Disposition: A | Discharge status: Home / Self Care | Payer: Self-pay | Attending: Internal Medicine | Admitting: Internal Medicine

## 2020-03-05 DIAGNOSIS — R42 Dizziness and giddiness: Secondary | ICD-10-CM

## 2020-03-05 NOTE — ED Triage Notes (Signed)
Patient reports that for an extended period of time he developed dizziness towards the end of the day. States that over the last 2-3 days he has had episodes where he feels like he is going to pass out.   States he went to the ED in June for same but left due to wait time. Lab work and EKG were unremarkable at that time.

## 2020-03-05 NOTE — ED Provider Notes (Signed)
[facility]    CSN: [id number] Arrival date & time: 03/05/20  1653     History   Chief Complaint Chief Complaint  Patient presents with   Dizziness    HPI Joseph Adkins is a 54 y.o. male c pmh of hypertension presents episodes unsteady gait when walking, extreme fatigue and occasional dizziness. Episodes ongoing for 2 years, but worse in last 2 weeks. Symptoms resolved c rest. Continues to exercise daily, gym 3x week.  Denies any sob, no orthopnea, cough, congestion, no chest pain, LE swelling, fever/chills, no dysuria, no decreased exercise tolerance. Monitors BP at home and typically runs 130's/80's.    Past Medical History:  Diagnosis Date   Hypertension     Patient Active Problem List   Diagnosis Date Noted   Near syncope 01/05/2017    History reviewed. No pertinent surgical history.    Home Medications    Prior to Admission medications   Medication Sig Start Date End Date Taking? Authorizing Provider  amLODipine (NORVASC) 5 MG tablet Take 1 tablet (5 mg total) by mouth daily. 07/09/19   Lelon Perla, MD  Flaxseed, Linseed, (FLAX SEED OIL) 1000 MG CAPS Take 1,000 mg by mouth daily.    [provider]  losartan (COZAAR) 100 MG tablet Take 1 tablet (100 mg total) by mouth daily. 07/09/19   Lelon Perla, MD    Family History Family History  Problem Relation Age of Onset   Diabetes Mother    Diabetes Maternal Grandmother    Diabetes Maternal Grandfather    Hypertension Maternal Grandfather    Hyperlipidemia Maternal Grandfather    Diabetes Paternal Grandmother    Diabetes Paternal Grandfather    Hypertension Paternal Grandfather     Social History Social History   Tobacco Use   Smoking status: Never Smoker   Smokeless tobacco: Never Used  Substance Use Topics   Alcohol use: Yes    Alcohol/week: 0.0 standard drinks    Comment: 16-20 beers per week   Drug use: No     Allergies   Patient has no known  allergies.   Review of Systems As stated in hpi, otherwise negative   Physical Exam Triage Vital Signs ED Triage Vitals  Enc Vitals Group     BP 03/05/20 1716 (!) 141/89     Pulse Rate 03/05/20 1716 (!) 105     Resp 03/05/20 1716 16     Temp 03/05/20 1716 98.8 F (37.1 C)     Temp Source 03/05/20 1716 Oral     SpO2 03/05/20 1716 99 %     Weight --      Height --      Head Circumference --      Peak Flow --      Pain Score 03/05/20 1715 0     Pain Loc --      Pain Edu? --      Excl. in [place]? --    Orthostatic VS for the past 24 hrs:  BP- Lying Pulse- Lying BP- Sitting Pulse- Sitting BP- Standing at 0 minutes Pulse- Standing at 0 minutes  03/05/20 1830 138/86 93 (!) 142/93 92 (!) 146/93 95    Updated Vital Signs BP (!) 141/89 (BP Location: Right Arm)    Pulse (!) 105    Temp 98.8 F (37.1 C) (Oral)    Resp 16    SpO2 99%   Visual Acuity Right Eye Distance:   Left Eye Distance:   Bilateral Distance:  Right Eye Near:   Left Eye Near:    Bilateral Near:     Physical Exam Constitutional:      General: He is not in acute distress.    Appearance: Normal appearance. He is normal weight. He is not ill-appearing.  HENT:     Right Ear: Tympanic membrane and ear canal normal.     Left Ear: Tympanic membrane and ear canal normal.     Nose: No congestion.     Mouth/Throat:     Mouth: Mucous membranes are moist.     Pharynx: Oropharynx is clear.  Eyes:     Extraocular Movements: Extraocular movements intact.     Pupils: Pupils are equal, round, and reactive to light.  Cardiovascular:     Rate and Rhythm: Normal rate and regular rhythm.  Pulmonary:     Effort: Pulmonary effort is normal.     Breath sounds: Normal breath sounds. No wheezing or rales.  Abdominal:     General: Bowel sounds are normal.     Palpations: Abdomen is soft.     Tenderness: There is no abdominal tenderness.  Musculoskeletal:        General: No swelling. Normal range of motion.     Cervical  back: Normal range of motion and neck supple.  Skin:    General: Skin is warm and dry.  Neurological:     General: No focal deficit present.     Mental Status: He is alert and oriented to person, place, and time.     Motor: No weakness.     Coordination: Coordination normal.     Gait: Gait normal.     Deep Tendon Reflexes: Reflexes normal.  Psychiatric:        Mood and Affect: Mood normal.        Behavior: Behavior normal.      UC Treatments / Results  Labs (all labs ordered are listed, but only abnormal results are displayed) Labs Reviewed - No data to display  EKG Sinus rhythm, LVH, no ischemic changes from previous ekgs  Radiology No results found.  Procedures Procedures (including critical care time)  Medications Ordered in UC Medications - No data to display  Initial Impression / Assessment and Plan / UC Course  I have reviewed the triage vital signs and the nursing notes.  Pertinent labs & imaging results that were available during my care of the patient were reviewed by me and considered in my medical decision making (see chart for details).  Dizziness, intermittent -with associated fatigue. Episodes self-limiting and ongoing x 2 years. No syncope -labs wnl during ED visit 6/21 -low suspicion for cardiac etiology. no reports of CP, no ischemic changes on ekg. He has been seen by cardiology with similar complaints in past.  Neg nuclear med study and no plans for further w/u.  -not orthostatic on today's visit -continue BP medication -encourage hydration, establish PCP for further w/u and possible neurology referral.  Reviewed expections re: course of current medical issues. Questions answered. Outlined signs and symptoms indicating need for more acute intervention. Pt verbalized understanding. AVS given    Final Clinical Impressions(s) / UC Diagnoses   Final diagnoses:  Dizziness     Discharge Instructions     I am uncertain on what is causing your  symptoms. Your ekg and vital signs are reassuring. Your physical exam is normal.  I would like you to establish a primary care doctor for this to be worked up if symptoms continue to  persist.  Go to the ER for any shortness of breath, chest pain or weakness.    ED Prescriptions    None     PDMP not reviewed this encounter.   Rudolpho Sevin, NP 03/05/20 [phone]

## 2020-03-05 NOTE — Discharge Instructions (Addendum)
I am uncertain on what is causing your symptoms. Your ekg and vital signs are reassuring. Your physical exam is normal.  I would like you to establish a primary care doctor for this to be worked up if symptoms continue to persist.  Go to the ER for any shortness of breath, chest pain or weakness.

## 2020-03-06 ENCOUNTER — Ambulatory Visit: Payer: Self-pay

## 2020-03-06 NOTE — Telephone Encounter (Signed)
Note copied from UC visit - 03/05/2020-r/t -Dizziness, intermittent -with associated fatigue. Episodes self-limiting and ongoing x 2 years. No syncope -labs wnl during ED visit 6/21 -low suspicion for cardiac etiology. no reports of CP, no ischemic changes on ekg. He has been seen by cardiology with similar complaints in past.  Neg nuclear med study and no plans for further w/u.  -not orthostatic on today's visit -continue BP medication -encourage hydration, establish PCP for further w/u and possible neurology referral. _______________________________  Will see if patient is able to get in the office soon than 12/30.  Schedule for a HFU- 03/20/2020.  Patient shouted that he feels great today.  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 He does not have ties to anyone on the team that could give him anxiety. He does fix ribs and other foods.  Encouraged a heart healthy diet secondary to HTN.   Patient verbalized understanding.

## 2020-03-06 NOTE — Telephone Encounter (Signed)
  Pt. Reports he has had dizziness on and of x 2 years. Reports "I saw a heart doctor and they checked me out and couldn't find anything wrong." Went to UC yesterday with dizziness. States he is fine early in the day and then the dizziness will occur. Lasts anywhere from a few minutes to 30 minutes.Pt. has a new pt. Appointment in December. Please advise pt. If he can be seen sooner. Instructed to return to UC for return of symptoms.  Answer Assessment - Initial Assessment Questions 1. DESCRIPTION: "Describe your dizziness."     Dizzy 2. LIGHTHEADED: "Do you feel lightheaded?" (e.g., somewhat faint, woozy, weak upon standing)     Woozy 3. VERTIGO: "Do you feel like either you or the room is spinning or tilting?" (i.e. vertigo)     No 4. SEVERITY: "How bad is it?"  "Do you feel like you are going to faint?" "Can you stand and walk?"   - MILD: Feels slightly dizzy, but walking normally.   - MODERATE: Feels very unsteady when walking, but not falling; interferes with normal activities (e.g., school, work) .   - SEVERE: Unable to walk without falling, or requires assistance to walk without falling; feels like passing out now.      Mild 5. ONSET:  "When did the dizziness begin?"     2 years ago 6. AGGRAVATING FACTORS: "Does anything make it worse?" (e.g., standing, change in head position)     Up moving around 7. HEART RATE: "Can you tell me your heart rate?" "How many beats in 15 seconds?"  (Note: not all patients can do this)       No 8. CAUSE: "What do you think is causing the dizziness?"     Unsure 9. RECURRENT SYMPTOM: "Have you had dizziness before?" If Yes, ask: "When was the last time?" "What happened that time?"     Yes 10. OTHER SYMPTOMS: "Do you have any other symptoms?" (e.g., fever, chest pain, vomiting, diarrhea, bleeding)       No 11. PREGNANCY: "Is there any chance you are pregnant?" "When was your last menstrual period?"       n/a  Protocols used: DIZZINESS [facility]

## 2020-03-20 ENCOUNTER — Other Ambulatory Visit: Payer: Self-pay

## 2020-03-20 ENCOUNTER — Ambulatory Visit: Payer: Self-pay | Attending: Family Medicine | Admitting: Family Medicine

## 2020-03-20 ENCOUNTER — Encounter: Payer: Self-pay | Admitting: Family Medicine

## 2020-03-20 VITALS — BP 148/94 | HR 98 | Temp 97.0°F | Ht 67.0 in | Wt 162.0 lb

## 2020-03-20 DIAGNOSIS — I10 Essential (primary) hypertension: Secondary | ICD-10-CM

## 2020-03-20 DIAGNOSIS — E785 Hyperlipidemia, unspecified: Secondary | ICD-10-CM

## 2020-03-20 DIAGNOSIS — R55 Syncope and collapse: Secondary | ICD-10-CM

## 2020-03-20 DIAGNOSIS — Z833 Family history of diabetes mellitus: Secondary | ICD-10-CM

## 2020-03-20 DIAGNOSIS — Z79899 Other long term (current) drug therapy: Secondary | ICD-10-CM

## 2020-03-20 DIAGNOSIS — Z125 Encounter for screening for malignant neoplasm of prostate: Secondary | ICD-10-CM

## 2020-03-20 DIAGNOSIS — J309 Allergic rhinitis, unspecified: Secondary | ICD-10-CM

## 2020-03-20 MED ORDER — LORATADINE 10 MG PO TABS: 10.0000 mg | ORAL_TABLET | Freq: Every day | ORAL | 11 refills | Status: DC

## 2020-03-20 NOTE — Progress Notes (Signed)
Feels faint a lot of times

## 2020-03-20 NOTE — Progress Notes (Signed)
Established Patient Office Visit  Subjective:  Patient ID: Joseph Adkins, male    DOB: [birth year]  Age: 54 y.o. MRN: [id number]  CC:  Chief Complaint  Patient presents with  . Hypertension    HPI Joseph Adkins presents, 54 year old male seen in follow-up of hypertension and patient reports episodes of feeling as if he might pass out.  He was seen and evaluated for dizziness/presyncope on 03/05/2020 in the emergency department.  He reports episodes for about 2 years where he will have sudden onset of dizziness/feel as if his gait is unsteady and become very fatigued.  Per ED records, there was low suspicion of a cardiac etiology of his symptoms as patient with prior cardiology work-up and normal EKG during emergency department visit.  Patient reports his home blood pressures are in the 130s over 80s with home monitoring.  He does notice some mild recent increase in nasal congestion.  Past Medical History:  Diagnosis Date  . Hypertension     Past Surgical History:  Procedure Laterality Date  . none      Family History  Problem Relation Age of Onset  . Diabetes Mother   . Hypertension Mother   . Diabetes Maternal Grandmother   . Diabetes Maternal Grandfather   . Hypertension Maternal Grandfather   . Hyperlipidemia Maternal Grandfather   . Diabetes Paternal Grandmother   . Diabetes Paternal Grandfather   . Hypertension Paternal Grandfather     Social History   Socioeconomic History  . Marital status: Single    Spouse name: Not on file  . Number of children: Not on file  . Years of education: Not on file  . Highest education level: Not on file  Occupational History  . Occupation: Teacher, adult education: [employer]  Tobacco Use  . Smoking status: Never Smoker  . Smokeless tobacco: Never Used  Substance and Sexual Activity  . Alcohol use: Yes    Alcohol/week: 0.0 standard drinks    Comment: 16-20 beers per week  . Drug use: No  . Sexual activity:  Not Currently  Other Topics Concern  . Not on file  Social History Narrative  . Not on file   Social Determinants of Health   Financial Resource Strain:   . Difficulty of Paying Living Expenses: Not on file  Food Insecurity:   . Worried About Charity fundraiser in the Last Year: Not on file  . Ran Out of Food in the Last Year: Not on file  Transportation Needs:   . Lack of Transportation (Medical): Not on file  . Lack of Transportation (Non-Medical): Not on file  Physical Activity:   . Days of Exercise per Week: Not on file  . Minutes of Exercise per Session: Not on file  Stress:   . Feeling of Stress : Not on file  Social Connections:   . Frequency of Communication with Friends and Family: Not on file  . Frequency of Social Gatherings with Friends and Family: Not on file  . Attends Religious Services: Not on file  . Active Member of Clubs or Organizations: Not on file  . Attends Archivist Meetings: Not on file  . Marital Status: Not on file  Intimate Partner Violence:   . Fear of Current or Ex-Partner: Not on file  . Emotionally Abused: Not on file  . Physically Abused: Not on file  . Sexually Abused: Not on file    Outpatient Medications Prior to Visit  Medication Sig Dispense Refill  . amLODipine (NORVASC) 5 MG tablet Take 1 tablet (5 mg total) by mouth daily. 90 tablet 3  . Flaxseed, Linseed, (FLAX SEED OIL) 1000 MG CAPS Take 1,000 mg by mouth daily.    Marland Kitchen losartan (COZAAR) 100 MG tablet Take 1 tablet (100 mg total) by mouth daily. 90 tablet 3   No facility-administered medications prior to visit.    No Known Allergies  ROS Review of Systems  Constitutional: Positive for fatigue. Negative for chills and fever.  HENT: Positive for congestion. Negative for sore throat and trouble swallowing.   Respiratory: Negative for cough and shortness of breath.   Cardiovascular: Negative for chest pain and palpitations.  Gastrointestinal: Negative for abdominal  pain, constipation, diarrhea and nausea.  Endocrine: Negative for polydipsia, polyphagia and polyuria.  Genitourinary: Negative for dysuria and frequency.  Musculoskeletal: Negative for arthralgias and back pain.  Skin: Negative for rash and wound.  Neurological: Positive for dizziness. Negative for headaches.  Hematological: Negative for adenopathy. Does not bruise/bleed easily.      Objective:    Physical Exam Constitutional:      Appearance: Normal appearance.  HENT:     Right Ear: Tympanic membrane, ear canal and external ear normal.     Left Ear: Tympanic membrane, ear canal and external ear normal.     Nose: Congestion and rhinorrhea (Mild clear nasal discharge) present.     Mouth/Throat:     Mouth: Mucous membranes are dry.  Neck:     Vascular: No carotid bruit.  Cardiovascular:     Rate and Rhythm: Normal rate and regular rhythm.  Pulmonary:     Effort: Pulmonary effort is normal.     Breath sounds: Normal breath sounds.  Abdominal:     Tenderness: There is no abdominal tenderness. There is no right CVA tenderness, left CVA tenderness, guarding or rebound.  Musculoskeletal:     Cervical back: Normal range of motion and neck supple.  Lymphadenopathy:     Cervical: No cervical adenopathy.  Neurological:     General: No focal deficit present.     Mental Status: He is alert and oriented to person, place, and time.  Psychiatric:        Mood and Affect: Mood normal.        Behavior: Behavior normal.     BP (!) 148/94   Pulse 98   Temp (!) 97 F (36.1 C)   Ht 5\' 7"  (1.702 m)   Wt 162 lb (73.5 kg)   SpO2 100%   BMI 25.37 kg/m  Wt Readings from Last 3 Encounters:  03/20/20 162 lb (73.5 kg)  11/18/19 170 lb (77.1 kg)  07/02/19 166 lb (75.3 kg)     Health Maintenance Due  Topic Date Due  . Hepatitis C Screening  Never done  . TETANUS/TDAP  Never done  . COLONOSCOPY  Never done  . INFLUENZA VACCINE  12/29/2019     Lab Results  Component Value Date    TSH 3.412 01/05/2017   Lab Results  Component Value Date   WBC 4.9 11/18/2019   HGB 13.1 11/18/2019   HCT 38.7 (L) 11/18/2019   MCV 95.1 11/18/2019   PLT 276 11/18/2019   Lab Results  Component Value Date   NA 139 11/18/2019   K 4.1 11/18/2019   CO2 25 11/18/2019   GLUCOSE 105 (H) 11/18/2019   BUN 11 11/18/2019   CREATININE 1.19 11/18/2019   BILITOT 0.5 03/20/2020   ALKPHOS  64 03/20/2020   AST 16 03/20/2020   ALT 18 03/20/2020   PROT 7.8 03/20/2020   ALBUMIN 4.5 03/20/2020   CALCIUM 8.9 11/18/2019   ANIONGAP 8 11/18/2019   Lab Results  Component Value Date   CHOL 229 (H) 03/20/2020   Lab Results  Component Value Date   HDL 87 03/20/2020   Lab Results  Component Value Date   LDLCALC 135 (H) 03/20/2020   Lab Results  Component Value Date   TRIG 40 03/20/2020   Lab Results  Component Value Date   CHOLHDL 2.6 03/20/2020   Lab Results  Component Value Date   HGBA1C 5.2 03/20/2020      Assessment & Plan:  1. Pre-syncope Notes from patient's emergency department visit reviewed and discussed with the patient and patient reports prior cardiology evaluation.  If he continues to have these episodes, would suggest referral to neurology which patient will consider.  2. Dyslipidemia; 3.  Encounter for long-term current use of medication Patient with dyslipidemia but is reluctant to try statin medication.  He is currently taking over-the-counter flaxseed oil.  Patient will have repeat lipid panel as well as hepatic function panel and he will be notified of treatment recommendations based on his results. - Hepatic Function Panel - Lipid panel  4. Allergic rhinitis, unspecified seasonality, unspecified trigger Evidence of allergic rhinitis on examination and prescription provided for loratadine.  This could be a contributing factor to his dizziness but doubt that this would cause significant presyncopal symptoms - loratadine (CLARITIN) 10 MG tablet; Take 1 tablet (10 mg  total) by mouth daily. As needed for congestion  Dispense: 30 tablet; Refill: 11  5. Family history of diabetes mellitus He reports family history of diabetes and will have hemoglobin A1c at today's visit - Hemoglobin A1c  6. Screening for prostate cancer PSA will be done as a screening test for prostate cancer - PSA  7. Essential hypertension Blood pressure is currently controlled on current medications.    Meds ordered this encounter  Medications  . loratadine (CLARITIN) 10 MG tablet    Sig: Take 1 tablet (10 mg total) by mouth daily. As needed for congestion    Dispense:  30 tablet    Refill:  11    Follow-up: Return in about 4 weeks (around 04/17/2020) for pre-syncope/HTN.    Antony Blackbird, MD

## 2020-03-21 LAB — HEPATIC FUNCTION PANEL
ALT: 18 IU/L (ref 0–44)
AST: 16 IU/L (ref 0–40)
Albumin: 4.5 g/dL (ref 3.8–4.9)
Alkaline Phosphatase: 64 IU/L (ref 44–121)
Bilirubin Total: 0.5 mg/dL (ref 0.0–1.2)
Bilirubin, Direct: 0.15 mg/dL (ref 0.00–0.40)
Total Protein: 7.8 g/dL (ref 6.0–8.5)

## 2020-03-21 LAB — LIPID PANEL
Chol/HDL Ratio: 2.6 ratio (ref 0.0–5.0)
Cholesterol, Total: 229 mg/dL — ABNORMAL HIGH (ref 100–199)
HDL: 87 mg/dL
LDL Chol Calc (NIH): 135 mg/dL — ABNORMAL HIGH (ref 0–99)
Triglycerides: 40 mg/dL (ref 0–149)
VLDL Cholesterol Cal: 7 mg/dL (ref 5–40)

## 2020-03-21 LAB — HEMOGLOBIN A1C
Est. average glucose Bld gHb Est-mCnc: 103 mg/dL
Hgb A1c MFr Bld: 5.2 % (ref 4.8–5.6)

## 2020-03-21 LAB — PSA: Prostate Specific Ag, Serum: 4 ng/mL (ref 0.0–4.0)

## 2020-03-27 ENCOUNTER — Telehealth: Payer: Self-pay | Admitting: Family Medicine

## 2020-03-27 NOTE — Telephone Encounter (Signed)
Copied from [place] [id number]. Topic: Quick Communication - Office Called Patient (Clinic Use ONLY) >> Mar 27, 2020 12:51 PM Joseph Adkins wrote: Reason for CRM: pt is returning call concerning blood work results

## 2020-03-27 NOTE — Telephone Encounter (Signed)
Spoke with pt about lab results.pt verified. voiced understanding.

## 2020-05-08 ENCOUNTER — Other Ambulatory Visit: Payer: Self-pay | Admitting: Cardiology

## 2020-05-08 DIAGNOSIS — I10 Essential (primary) hypertension: Secondary | ICD-10-CM

## 2020-05-28 ENCOUNTER — Encounter: Payer: Self-pay | Admitting: Family Medicine

## 2020-05-28 ENCOUNTER — Ambulatory Visit: Payer: Self-pay | Attending: Family Medicine | Admitting: Family Medicine

## 2020-05-28 ENCOUNTER — Other Ambulatory Visit: Payer: Self-pay

## 2020-05-28 VITALS — BP 148/98 | HR 105 | Ht 67.0 in | Wt 165.0 lb

## 2020-05-28 DIAGNOSIS — R42 Dizziness and giddiness: Secondary | ICD-10-CM

## 2020-05-28 DIAGNOSIS — I10 Essential (primary) hypertension: Secondary | ICD-10-CM

## 2020-05-28 MED ORDER — AMLODIPINE BESYLATE 5 MG PO TABS: ORAL_TABLET | ORAL | 6 refills | Status: DC

## 2020-05-28 MED ORDER — LOSARTAN POTASSIUM 100 MG PO TABS: ORAL_TABLET | ORAL | 6 refills | Status: DC

## 2020-05-28 NOTE — Patient Instructions (Signed)

## 2020-05-28 NOTE — Progress Notes (Signed)
Still having dizzy spells. Having pain in his hands.

## 2020-05-28 NOTE — Progress Notes (Signed)
Subjective:  Patient ID: Joseph Adkins, male    DOB: [birth year]  Age: 54 y.o. MRN: [id number]  CC: Hypertension   HPI Joseph Adkins is a 54 year old male with a history of hypertension who presents today for chronic disease management and to establish care. Compliant with his antihypertensive but complains about chronic dizziness.  Dizziness feels like he is walking on wobbly legs and symptoms have been present for 3 years. Feels like he deviates when walking but has no vertigo, no headcahe, blurry vision. Sometimes feels lightheaded and 'like he will pass out'. Last episode occurred 2 weeks ago at work and he sat down and used some ice over his head and felt better. He has had previous cardiac work-up including carotid Dopplers which were negative for carotid artery stenosis. CT head from 12/2016 was unremarkable. Nuclear stress test from 04/2017: Study Highlights  Nuclear stress EF: calculated at 47% but appears to be at least 55%.  The left ventricular ejection fraction is mildly decreased (45-54%).  No change from baseline T wave inversions in the inferolateral leads  The study is normal.  This is a low risk study.  Echocardiogram from 12/2016: Study Conclusions   - Left ventricle: The cavity size was normal. There was moderate  concentric hypertrophy. Systolic function was vigorous. The  estimated ejection fraction was in the range of 65% to 70%.  Doppler parameters are consistent with abnormal left ventricular  relaxation (grade 1 diastolic dysfunction). There was no evidence  of elevated ventricular filling pressure by Doppler parameters.  - Aortic valve: There was no regurgitation.  - Mitral valve: There was trivial regurgitation.  - Right atrium: The atrium was normal in size.  - Tricuspid valve: There was trivial regurgitation.  - Pulmonic valve: There was trivial regurgitation.  - Pulmonary arteries: Systolic pressure was within the normal  range.   - Pericardium, extracardiac: The pericardium was normal in  appearance.   Past Medical History:  Diagnosis Date  . Hypertension     Past Surgical History:  Procedure Laterality Date  . none      Family History  Problem Relation Age of Onset  . Diabetes Mother   . Hypertension Mother   . Diabetes Maternal Grandmother   . Diabetes Maternal Grandfather   . Hypertension Maternal Grandfather   . Hyperlipidemia Maternal Grandfather   . Diabetes Paternal Grandmother   . Diabetes Paternal Grandfather   . Hypertension Paternal Grandfather     No Known Allergies  Outpatient Medications Prior to Visit  Medication Sig Dispense Refill  . Flaxseed, Linseed, (FLAX SEED OIL) 1000 MG CAPS Take 1,000 mg by mouth daily.    Marland Kitchen loratadine (CLARITIN) 10 MG tablet Take 1 tablet (10 mg total) by mouth daily. As needed for congestion 30 tablet 11  . amLODipine (NORVASC) 5 MG tablet TAKE 1 TABLET(5 MG) BY MOUTH DAILY 90 tablet 1  . losartan (COZAAR) 100 MG tablet TAKE 1 TABLET(100 MG) BY MOUTH DAILY 60 tablet 1   No facility-administered medications prior to visit.     ROS Review of Systems  Constitutional: Negative for activity change and appetite change.  HENT: Negative for sinus pressure and sore throat.   Eyes: Negative for visual disturbance.  Respiratory: Negative for cough, chest tightness and shortness of breath.   Cardiovascular: Negative for chest pain and leg swelling.  Gastrointestinal: Negative for abdominal distention, abdominal pain, constipation and diarrhea.  Endocrine: Negative.   Genitourinary: Negative for dysuria.  Musculoskeletal: Negative for joint  swelling and myalgias.  Skin: Negative for rash.  Allergic/Immunologic: Negative.   Neurological: Positive for dizziness. Negative for weakness, light-headedness and numbness.  Psychiatric/Behavioral: Negative for dysphoric mood and suicidal ideas.    Objective:  BP (!) 148/98   Pulse (!) 105   Ht 5\' 7"  (1.702 m)    Wt 165 lb (74.8 kg)   SpO2 99%   BMI 25.84 kg/m   BP/Weight 05/28/2020 03/20/2020 [id number]  Systolic BP [id number]  Diastolic BP 98 94 89  Wt. (Lbs) 165 162 -  BMI 25.84 25.37 -      Physical Exam Constitutional:      Appearance: He is well-developed.  HENT:     Right Ear: Tympanic membrane normal.     Left Ear: Tympanic membrane normal.  Neck:     Vascular: No JVD.  Cardiovascular:     Rate and Rhythm: Tachycardia present.     Heart sounds: Normal heart sounds. No murmur heard.   Pulmonary:     Effort: Pulmonary effort is normal.     Breath sounds: Normal breath sounds. No wheezing or rales.  Chest:     Chest wall: No tenderness.  Abdominal:     General: Bowel sounds are normal. There is no distension.     Palpations: Abdomen is soft. There is no mass.     Tenderness: There is no abdominal tenderness.  Musculoskeletal:        General: Normal range of motion.     Right lower leg: No edema.     Left lower leg: No edema.  Neurological:     Mental Status: He is alert and oriented to person, place, and time.     Motor: No weakness or tremor.     Coordination: Romberg sign negative. Coordination normal. Finger-Nose-Finger Test and Heel to [facility] Test normal. Rapid alternating movements normal.  Psychiatric:        Mood and Affect: Mood normal.     CMP Latest Ref Rng & Units 03/20/2020 11/18/2019 10/24/2019  Glucose 70 - 99 mg/dL - 105(H) 114(H)  BUN 6 - 20 mg/dL - 11 12  Creatinine 0.61 - 1.24 mg/dL - 1.19 1.12  Sodium 135 - 145 mmol/L - 139 139  Potassium 3.5 - 5.1 mmol/L - 4.1 4.3  Chloride 98 - 111 mmol/L - 106 102  CO2 22 - 32 mmol/L - 25 27  Calcium 8.9 - 10.3 mg/dL - 8.9 9.2  Total Protein 6.0 - 8.5 g/dL 7.8 - -  Total Bilirubin 0.0 - 1.2 mg/dL 0.5 - -  Alkaline Phos 44 - 121 IU/L 64 - -  AST 0 - 40 IU/L 16 - -  ALT 0 - 44 IU/L 18 - -    Lipid Panel     Component Value Date/Time   CHOL 229 (H) 03/20/2020 1030   TRIG 40 03/20/2020 1030   HDL 87  03/20/2020 1030   CHOLHDL 2.6 03/20/2020 1030   CHOLHDL 2.7 06/23/2015 0942   VLDL 9 06/23/2015 0942   LDLCALC 135 (H) 03/20/2020 1030    CBC    Component Value Date/Time   WBC 4.9 11/18/2019 1539   RBC 4.07 (L) 11/18/2019 1539   HGB 13.1 11/18/2019 1539   HCT 38.7 (L) 11/18/2019 1539   PLT 276 11/18/2019 1539   MCV 95.1 11/18/2019 1539   MCH 32.2 11/18/2019 1539   MCHC 33.9 11/18/2019 1539   RDW 12.8 11/18/2019 1539    Lab Results  Component Value Date  HGBA1C 5.2 03/20/2020    Assessment & Plan:  1. Essential hypertension Slightly above goal No regimen change today but he will work on lifestyle modification Counseled on blood pressure goal of less than 130/80, low-sodium, DASH diet, medication compliance, 150 minutes of moderate intensity exercise per week. Discussed medication compliance, adverse effects. - losartan (COZAAR) 100 MG tablet; TAKE 1 TABLET(100 MG) BY MOUTH DAILY  Dispense: 60 tablet; Refill: 6 - amLODipine (NORVASC) 5 MG tablet; TAKE 1 TABLET(5 MG) BY MOUTH DAILY  Dispense: 30 tablet; Refill: 6  2. Dizziness Unclear etiology Vertigo unlikely Cardiac work-up so far unrevealing CT head negative - Ambulatory referral to Neurology    Meds ordered this encounter  Medications  . losartan (COZAAR) 100 MG tablet    Sig: TAKE 1 TABLET(100 MG) BY MOUTH DAILY    Dispense:  60 tablet    Refill:  6  . amLODipine (NORVASC) 5 MG tablet    Sig: TAKE 1 TABLET(5 MG) BY MOUTH DAILY    Dispense:  30 tablet    Refill:  6    Follow-up: Return in about 3 months (around 08/26/2020).       Hoy Register, MD, FAAFP. [facility] [place], [place] [phone]   05/28/2020, 12:18 PM

## 2020-06-08 ENCOUNTER — Encounter: Payer: Self-pay | Admitting: Neurology

## 2020-07-20 IMAGING — CR CHEST - 2 VIEW
2 series · 2 of 2 positions shown · non-contrast
Comparison: 09/09/2017 and earlier.

CLINICAL DATA: 53-year-old male with chest pain and hypertension,
183 systolic.

EXAM:
CHEST - 2 VIEW

[chest pa]
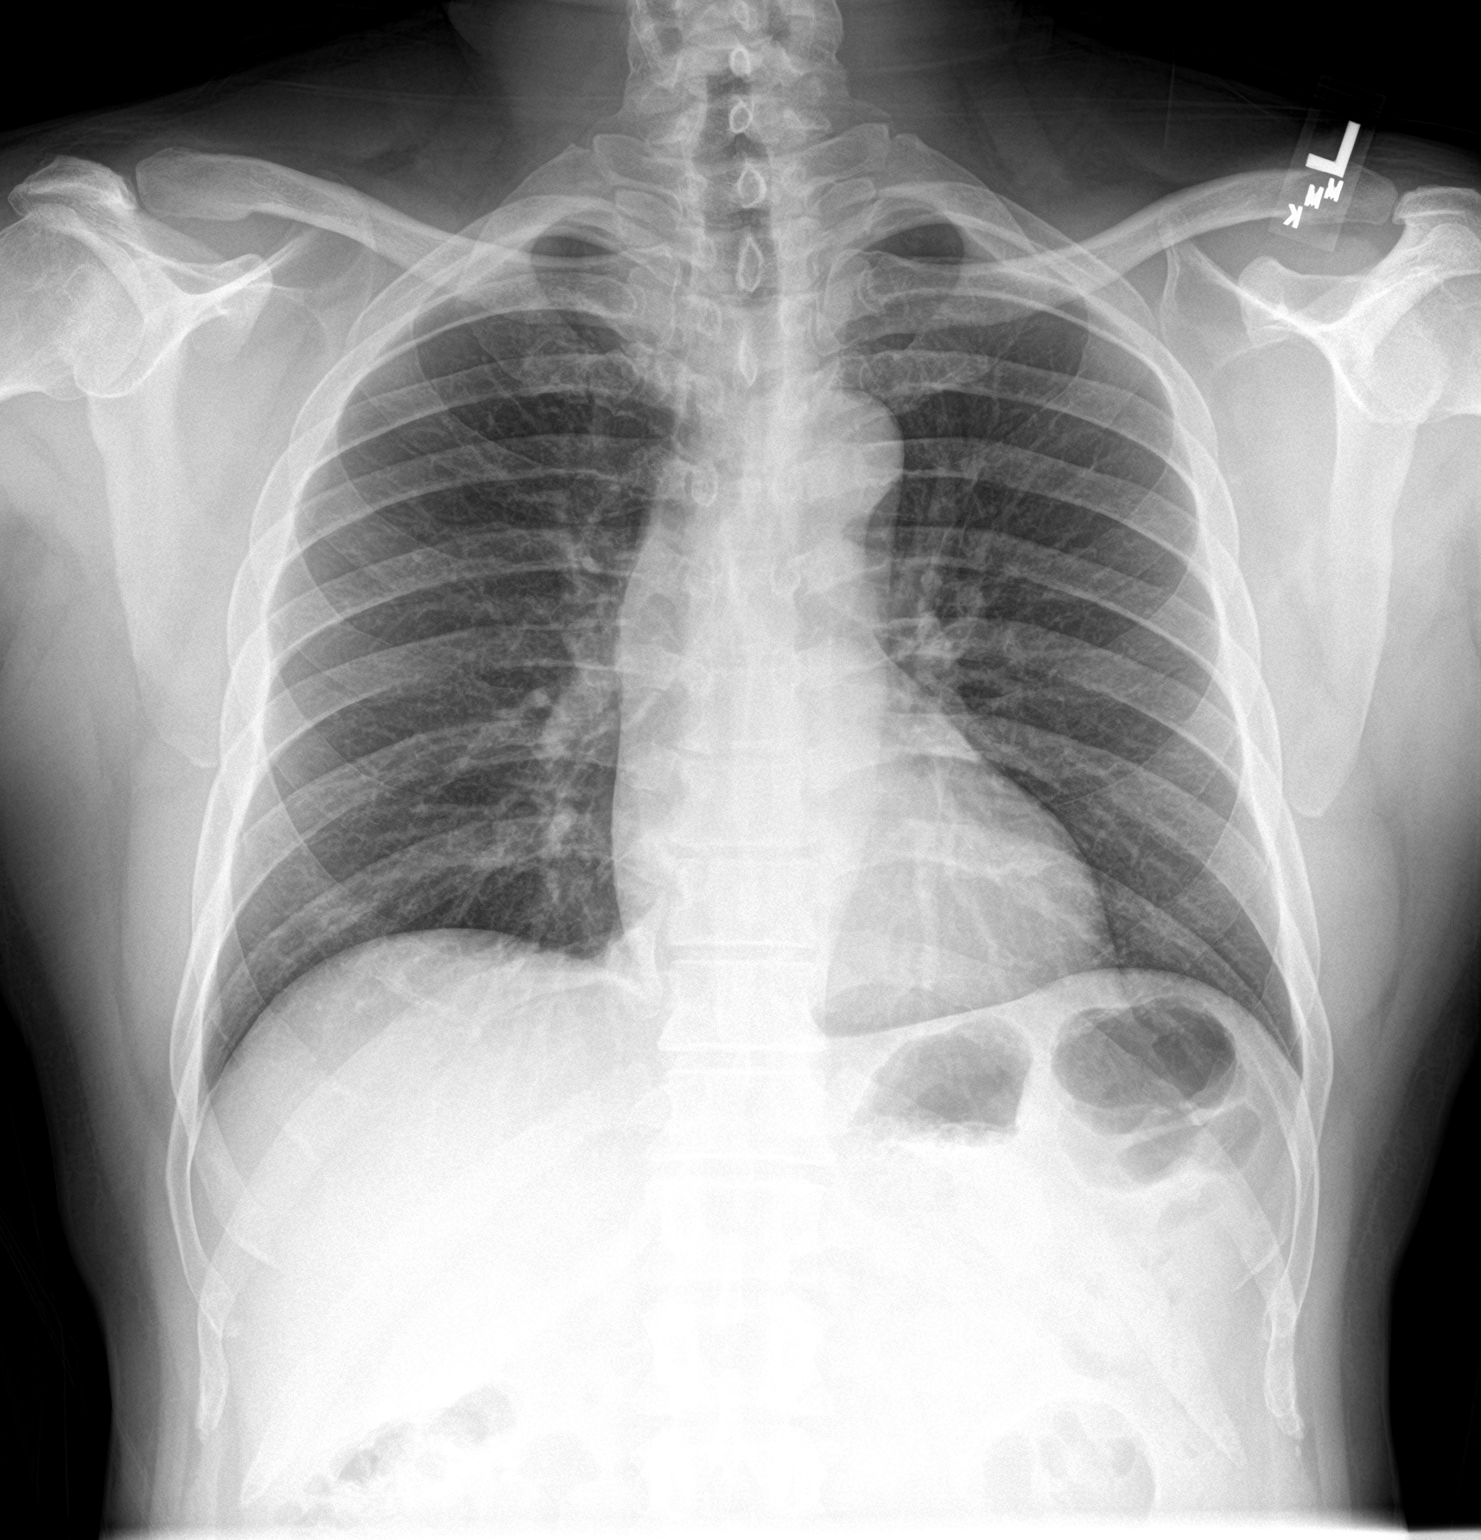

[chest lat]
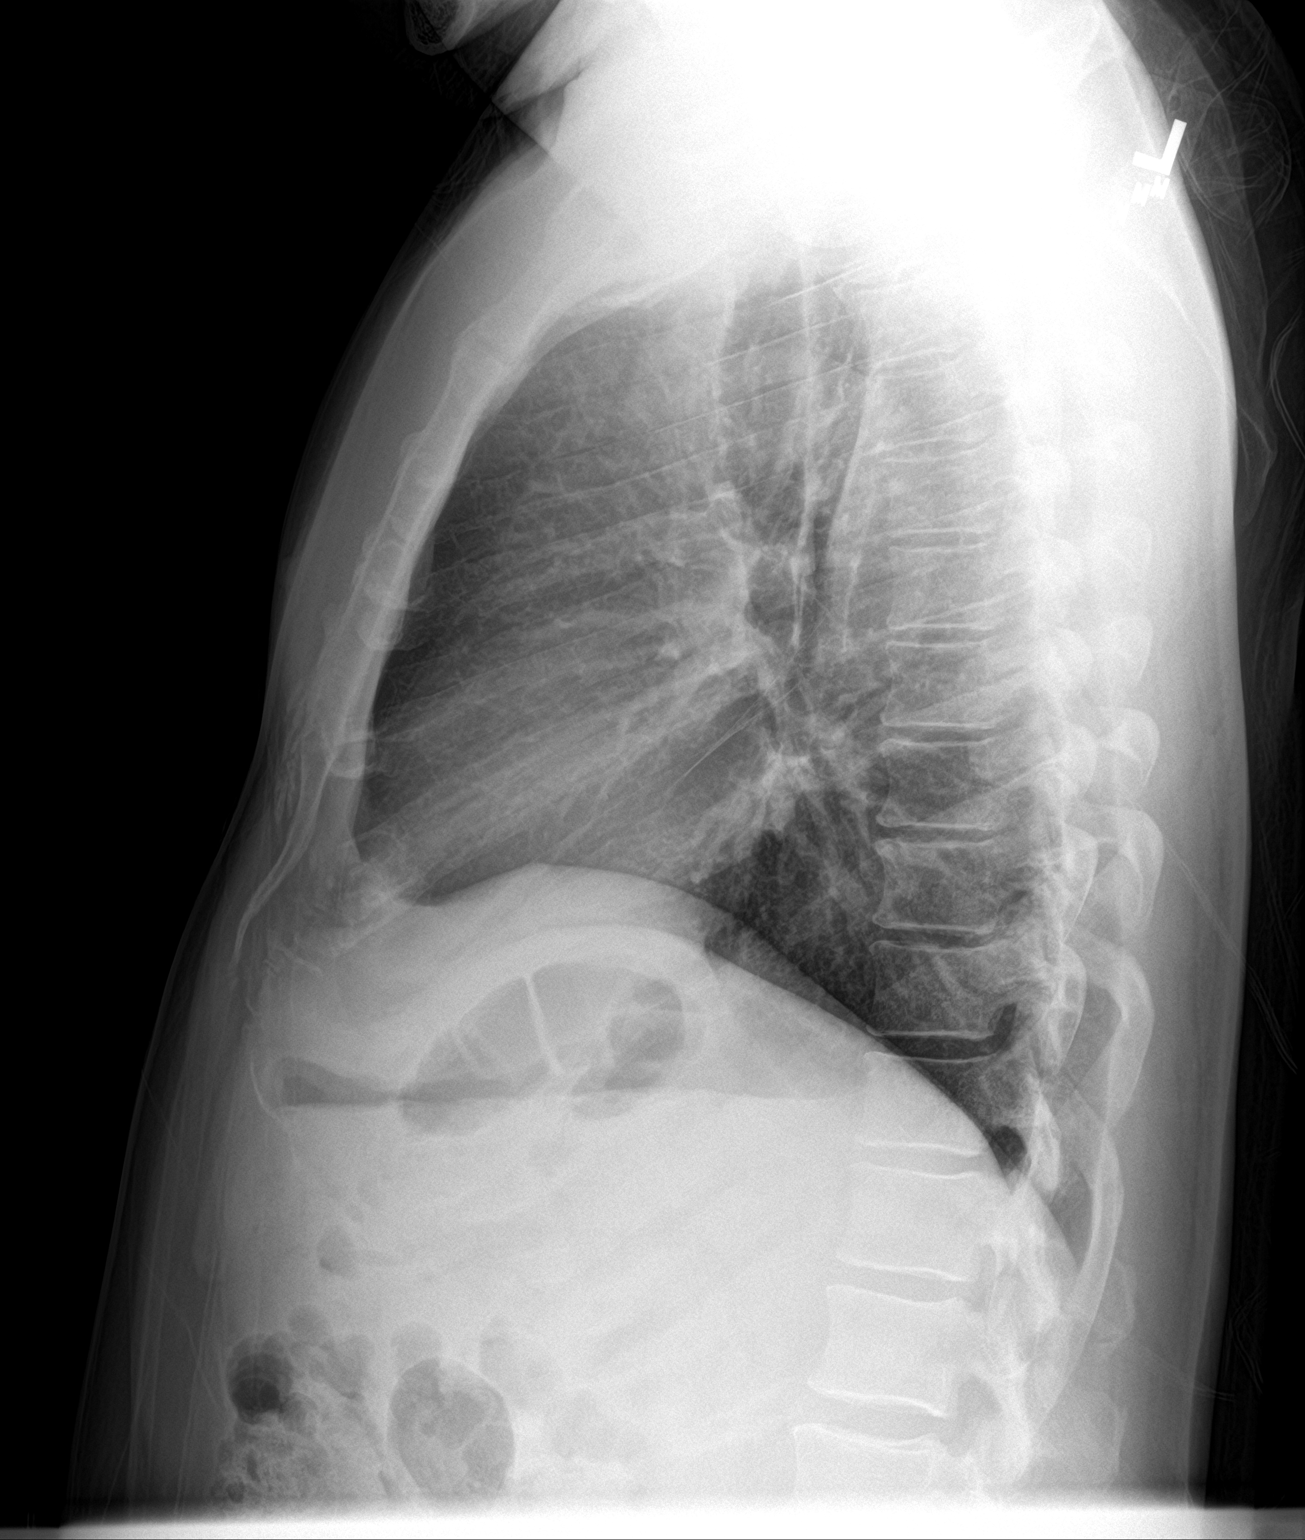

[2 of 2 positions shown; findings below may reference images not displayed]

FINDINGS: Lung volumes and mediastinal contours remain normal. Visualized
tracheal air column is within normal limits. Both lungs remain
clear. No pneumothorax or pleural effusion. Negative visible osseous
structures and bowel gas pattern.
IMPRESSION: Negative.  No cardiopulmonary abnormality.

## 2020-08-04 NOTE — Progress Notes (Signed)
NEUROLOGY CONSULTATION NOTE  Joseph Adkins MRN: [id number] DOB: 15-Jun-1965  Referring provider: Charlott Rakes, MD Primary care provider: Charlott Rakes, MD  Reason for consult:  dizziness  Assessment/Plan:   1.  Dizziness - more of a near syncope - check for vertebrobasilar insufficiency 2.  Paresthesia  1.  Check CTA of head 2.  Check ANA, sed rate, B12, TSH, SPEP/IFE, ACE 3.  NCV-EMG of right upper and lower extremities 4.  Follow up after testing   Subjective:  Joseph Adkins is a 55 year old right-handed male with HTN who presents for dizziness and tingling.  History supplemented by Urgent Care and referring provider's notes.  CT head on 01/05/2017 personally reviewed.  Since 2018, he reports unsteady gait, fatigue and episodic dizziness.  It started after he was placed on a blood pressure medication.  He feels like he is going to pass out.  Symptoms resolve at rest.  Work up at that time was unremarkable, including echocardiogram, nuclear stress test, carotid ultrasound and CT head.  Blood pressures at home typically run in the 130s/80s.  EKG showed no acute changes.  Orthostatic vitals have previously been negative.    Within the past year, he reports  tingling in the hands sometimes radiating up the arms. Sometimes his hands cramp lock up at times.  He also reports tingling in his feet.  Denies pain in neck and back.  Denies radicular pain down arms and legs.  Denies extremity weakness.  In October, Hgb A1c was 5.2.       PAST MEDICAL HISTORY: Past Medical History:  Diagnosis Date  . Hypertension     PAST SURGICAL HISTORY: Past Surgical History:  Procedure Laterality Date  . none      MEDICATIONS: Current Outpatient Medications on File Prior to Visit  Medication Sig Dispense Refill  . amLODipine (NORVASC) 5 MG tablet TAKE 1 TABLET(5 MG) BY MOUTH DAILY 30 tablet 6  . Flaxseed, Linseed, (FLAX SEED OIL) 1000 MG CAPS Take 1,000 mg by mouth daily.    Marland Kitchen  loratadine (CLARITIN) 10 MG tablet Take 1 tablet (10 mg total) by mouth daily. As needed for congestion 30 tablet 11  . losartan (COZAAR) 100 MG tablet TAKE 1 TABLET(100 MG) BY MOUTH DAILY 60 tablet 6   No current facility-administered medications on file prior to visit.    ALLERGIES: No Known Allergies  FAMILY HISTORY: Family History  Problem Relation Age of Onset  . Diabetes Mother   . Hypertension Mother   . Diabetes Maternal Grandmother   . Diabetes Maternal Grandfather   . Hypertension Maternal Grandfather   . Hyperlipidemia Maternal Grandfather   . Diabetes Paternal Grandmother   . Diabetes Paternal Grandfather   . Hypertension Paternal Grandfather     Objective:  Blood pressure (!) 148/93, pulse 89, height 5\' 6"  (1.676 m), weight 167 lb 12.8 oz (76.1 kg), SpO2 99 %. General: No acute distress.  Patient appears well-groomed.   Head:  Normocephalic/atraumatic Eyes:  fundi examined but not visualized Neck: supple, no paraspinal tenderness, full range of motion Back: No paraspinal tenderness Heart: regular rate and rhythm Lungs: Clear to auscultation bilaterally. Vascular: No carotid bruits. Neurological Exam: Mental status: alert and oriented to person, place, and time, recent and remote memory intact, fund of knowledge intact, attention and concentration intact, speech fluent and not dysarthric, language intact. Cranial nerves: CN I: not tested CN II: pupils equal, round and reactive to light, visual fields intact CN III, IV, VI:  full range of motion, no nystagmus, no ptosis CN V: facial sensation intact. CN VII: upper and lower face symmetric CN VIII: hearing intact CN IX, X: gag intact, uvula midline CN XI: sternocleidomastoid and trapezius muscles intact CN XII: tongue midline Bulk & Tone: normal, no fasciculations. Motor:  muscle strength 5/5 throughout Sensation:  Pinprick, temperature and vibratory sensation intact. Deep Tendon Reflexes:  2+ throughout,   toes downgoing.   Finger to nose testing:  Without dysmetria.   Heel to shin:  Without dysmetria.   Gait:  Normal station and stride.  Romberg negative.    Thank you for allowing me to take part in the care of this patient.  Metta Clines, DO  CC:  Charlott Rakes, MD

## 2020-08-05 ENCOUNTER — Ambulatory Visit (INDEPENDENT_AMBULATORY_CARE_PROVIDER_SITE_OTHER): Payer: Self-pay | Admitting: Neurology

## 2020-08-05 ENCOUNTER — Other Ambulatory Visit (INDEPENDENT_AMBULATORY_CARE_PROVIDER_SITE_OTHER): Payer: Self-pay

## 2020-08-05 ENCOUNTER — Encounter: Payer: Self-pay | Admitting: Neurology

## 2020-08-05 ENCOUNTER — Other Ambulatory Visit: Payer: Self-pay

## 2020-08-05 VITALS — BP 148/93 | HR 89 | Ht 66.0 in | Wt 167.8 lb

## 2020-08-05 DIAGNOSIS — R42 Dizziness and giddiness: Secondary | ICD-10-CM

## 2020-08-05 DIAGNOSIS — R202 Paresthesia of skin: Secondary | ICD-10-CM

## 2020-08-05 DIAGNOSIS — G450 Vertebro-basilar artery syndrome: Secondary | ICD-10-CM

## 2020-08-05 LAB — VITAMIN B12: Vitamin B-12: 278 pg/mL (ref 211–911)

## 2020-08-05 LAB — TSH: TSH: 3.86 u[IU]/mL (ref 0.35–4.50)

## 2020-08-05 NOTE — Patient Instructions (Addendum)
1.  Check CTA of head.  We have sent a referral to [facility] for your CTA and they will call you directly to schedule your appointment. They are located at [place]. If you need to contact them directly please call [phone].  2.  Check ANA with reflex, sed rate, B12, TSH, SPEP/IFE, ACE 3.  Check nerve study of right arm and leg 4.  Follow up after testing.

## 2020-08-07 LAB — PROTEIN ELECTROPHORESIS, SERUM
Albumin ELP: 4.4 g/dL (ref 3.8–4.8)
Alpha 1: 0.3 g/dL (ref 0.2–0.3)
Alpha 2: 0.6 g/dL (ref 0.5–0.9)
Beta 2: 0.5 g/dL (ref 0.2–0.5)
Beta Globulin: 0.4 g/dL (ref 0.4–0.6)
Gamma Globulin: 1.4 g/dL (ref 0.8–1.7)
Total Protein: 7.6 g/dL (ref 6.1–8.1)

## 2020-08-07 LAB — ANGIOTENSIN CONVERTING ENZYME: Angiotensin-Converting Enzyme: 24 U/L (ref 9–67)

## 2020-08-09 LAB — IMMUNOFIXATION, SERUM
IgA/Immunoglobulin A, Serum: 365 mg/dL (ref 90–386)
IgG (Immunoglobin G), Serum: 1592 mg/dL (ref 603–1613)
IgM (Immunoglobulin M), Srm: 98 mg/dL (ref 20–172)

## 2020-08-09 LAB — ANA W/REFLEX: Anti Nuclear Antibody (ANA): NEGATIVE

## 2020-08-10 ENCOUNTER — Telehealth: Payer: Self-pay

## 2020-08-10 DIAGNOSIS — D472 Monoclonal gammopathy: Secondary | ICD-10-CM

## 2020-08-10 NOTE — Telephone Encounter (Signed)
-----   Message from Pieter Partridge, DO sent at 08/10/2020  6:55 AM EDT ----- Labs for neuropathy have been reviewed.  B12 level is in the low-normal range which may cause neuropathy symptoms.  Recommend taking B12 1068mcg daily.  There is evidence of an abnormal protein level in the blood which may or may not be significant.  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

## 2020-08-10 NOTE — Telephone Encounter (Signed)
Called patient and left a message for a call back.  

## 2020-08-11 NOTE — Telephone Encounter (Signed)
Patient returned call and was informed of results and recommendations from Dr. Tomi Likens. Patient is ok with a referral being sent to Hematology.

## 2020-08-11 NOTE — Telephone Encounter (Signed)
-----   Message from Pieter Partridge, DO sent at 08/10/2020  6:55 AM EDT ----- Labs for neuropathy have been reviewed.  B12 level is in the low-normal range which may cause neuropathy symptoms.  Recommend taking B12 1078mcg daily.  There is evidence of an abnormal protein level in the blood which may or may not be significant.  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

## 2020-08-24 ENCOUNTER — Telehealth: Payer: Self-pay | Admitting: Physician Assistant

## 2020-08-24 NOTE — Telephone Encounter (Signed)
Received a new hem referral from Dr. Tomi Likens at [facility] for monoclonal gammopathy. Mr. Joseph Adkins returned my call and has been scheduled to see [name] on 4/8 at [place]. Pt aware to arrive 20 minutes early.

## 2020-08-27 ENCOUNTER — Ambulatory Visit: Payer: Self-pay | Admitting: Family Medicine

## 2020-08-28 ENCOUNTER — Telehealth: Payer: Self-pay

## 2020-08-28 ENCOUNTER — Ambulatory Visit
Admission: RE | Admit: 2020-08-28 | Discharge: 2020-08-28 | Disposition: A | Discharge status: Home / Self Care | Payer: No Typology Code available for payment source | Source: Ambulatory Visit | Attending: Neurology | Admitting: Neurology

## 2020-08-28 DIAGNOSIS — G450 Vertebro-basilar artery syndrome: Secondary | ICD-10-CM

## 2020-08-28 MED ORDER — IOPAMIDOL (ISOVUE-370) INJECTION 76%
75.0000 mL | Freq: Once | INTRAVENOUS | Status: AC | PRN
  Administered 2020-08-28: 75 mL via INTRAVENOUS

## 2020-08-28 NOTE — Telephone Encounter (Signed)
-----   Message from Pieter Partridge, DO sent at 08/28/2020  4:00 PM EDT ----- CT of the arteries in the head do not show any cause for dizziness.  However, one of the blood vessels in the head has some plaque build up.  I would recommend starting an aspirin 81mg  daily.  I would also work on getting her cholesterol under optimal control as well.

## 2020-08-28 NOTE — Telephone Encounter (Signed)
Pt called back and was informed that CT of the arteries in the head do not show any cause for dizziness. However, one of the blood vessels in the head has some plaque build up. Dr Tomi Likens  recommend starting an aspirin 81mg  daily.  Pt  Also informed to work on getting his cholesterol under optimal control as well

## 2020-08-28 NOTE — Telephone Encounter (Signed)
Pt called no answer left a voice mail to call the office back  °

## 2020-09-03 NOTE — Progress Notes (Signed)
[facility] [phone]   [phone]  INITIAL CONSULT NOTE  Patient Care Team: Charlott Rakes, MD as PCP - General (Family Medicine)  Hematological/Oncological History 1) 08/05/2020: Labs from Dr. 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   2) 09/04/2020: Establish care with Dede Query PA-C  CHIEF COMPLAINTS/PURPOSE OF CONSULTATION:  "Monoclonal gammopathy"  HISTORY OF PRESENTING ILLNESS:  Joseph Adkins 55 y.o. 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 Patient is unaccompanied for this visit. Mr. Craighead reports that his energy and appetite are stable. He works full time as a Administrator, arts. He denies any dietary restrictions or noted weight loss. Patient denies any nausea, vomiting or abdominal pain. He has regular bowel movements without any hematochezia or melena. Occasionally, patient notices specks of blood in his urine without any other urinary symptoms. Over the last 3-4 years, patient reports near syncope episodes without any known triggers. Symptoms resolve at rest. Over the past year, patient reports neuropathy in his fingers and lower extremities. Occasionally, his hands cramp up. He denies any interference with grip but admits to some unsteady gait. Patient denies any fevers, chills, sweats, chest pain, shortness of breath or cough. He has no other complaints. Rest of the 10 point ROS is below.    MEDICAL HISTORY:  Past Medical History:  Diagnosis Date  . Hypertension     SURGICAL HISTORY: Past Surgical History:  Procedure Laterality Date  . none      SOCIAL HISTORY: Social History   Socioeconomic History  . Marital status: Single    Spouse name: Not on file  . Number of children: Not on file  . Years of education: Not  on file  . Highest education level: Not on file  Occupational History  . Occupation: Teacher, adult education: [employer]  Tobacco Use  . Smoking status: Never Smoker  . Smokeless tobacco: Never Used  Substance and Sexual Activity  . Alcohol use: Yes    Alcohol/week: 0.0 standard drinks    Comment: 16-20 beers per week  . Drug use: No  . Sexual activity: Not Currently  Other Topics Concern  . Not on file  Social History Narrative   Right handed   Social Determinants of Health   Financial Resource Strain: Not on file  Food Insecurity: Not on file  Transportation Needs: Not on file  Physical Activity: Not on file  Stress: Not on file  Social Connections: Not on file  Intimate Partner Violence: Not on file    FAMILY HISTORY: Family History  Problem Relation Age of Onset  . Diabetes Mother   . Hypertension Mother   . Diabetes Maternal Grandmother   . Diabetes Maternal Grandfather   . Hypertension Maternal Grandfather   . Hyperlipidemia Maternal Grandfather   . Diabetes Paternal Grandmother   . Diabetes Paternal Grandfather   . Hypertension Paternal Grandfather     ALLERGIES:  has No Known Allergies.  MEDICATIONS:  Current Outpatient Medications  Medication Sig Dispense Refill  . amLODipine (NORVASC) 5 MG tablet TAKE 1 TABLET(5 MG) BY MOUTH DAILY 30 tablet 6  . Flaxseed, Linseed, (FLAX SEED OIL) 1000 MG CAPS Take 1,000 mg by mouth daily.    Marland Kitchen losartan (COZAAR) 100 MG tablet TAKE 1 TABLET(100 MG) BY MOUTH DAILY 60 tablet 6   No current facility-administered medications for this  visit.    REVIEW OF SYSTEMS:   Constitutional: ( - ) fevers, ( - )  chills , ( - ) night sweats Eyes: ( - ) blurriness of vision, ( - ) double vision, ( - ) watery eyes Ears, nose, mouth, throat, and face: ( - ) mucositis, ( - ) sore throat Respiratory: ( - ) cough, ( - ) dyspnea, ( - ) wheezes Cardiovascular: ( - ) palpitation, ( - ) chest discomfort, ( - ) lower extremity  swelling Gastrointestinal:  ( - ) nausea, ( - ) heartburn, ( - ) change in bowel habits Skin: ( - ) abnormal skin rashes Lymphatics: ( - ) new lymphadenopathy, ( - ) easy bruising Neurological: ( + ) numbness, ( - ) tingling, ( - ) new weaknesses Behavioral/Psych: ( - ) mood change, ( - ) new changes  All other systems were reviewed with the patient and are negative.  PHYSICAL EXAMINATION: ECOG PERFORMANCE STATUS: 0 - Asymptomatic  Vitals:   09/04/20 0913  BP: (!) 148/90  Pulse: 98  Resp: 17  Temp: (!) 97 F (36.1 C)  SpO2: 99%   Filed Weights   09/04/20 0913  Weight: 164 lb 1.6 oz (74.4 kg)    GENERAL: well appearing male in NAD  SKIN: skin color, texture, turgor are normal, no rashes or significant lesions EYES: conjunctiva are pink and non-injected, sclera clear OROPHARYNX: no exudate, no erythema; lips, buccal mucosa, and tongue normal  NECK: supple, non-tender LYMPH:  no palpable lymphadenopathy in the cervical, axillary or supraclavicular lymph nodes.  LUNGS: clear to auscultation and percussion with normal breathing effort HEART: regular rate & rhythm and no murmurs and no lower extremity edema ABDOMEN: soft, non-tender, non-distended, normal bowel sounds Musculoskeletal: no cyanosis of digits and no clubbing  PSYCH: alert & oriented x 3, fluent speech NEURO: no focal motor/sensory deficits  LABORATORY DATA:  I have reviewed the data as listed CBC Latest Ref Rng & Units 11/18/2019 10/24/2019 05/06/2019  WBC 4.0 - 10.5 K/uL 4.9 5.0 3.7(L)  Hemoglobin 13.0 - 17.0 g/dL 13.1 13.5 14.5  Hematocrit 39.0 - 52.0 % 38.7(L) 39.1 42.3  Platelets 150 - 400 K/uL 276 301 334    CMP Latest Ref Rng & Units 08/05/2020 03/20/2020 11/18/2019  Glucose 70 - 99 mg/dL - - 105(H)  BUN 6 - 20 mg/dL - - 11  Creatinine 0.61 - 1.24 mg/dL - - 1.19  Sodium 135 - 145 mmol/L - - 139  Potassium 3.5 - 5.1 mmol/L - - 4.1  Chloride 98 - 111 mmol/L - - 106  CO2 22 - 32 mmol/L - - 25  Calcium 8.9 -  10.3 mg/dL - - 8.9  Total Protein 6.1 - 8.1 g/dL 7.6 7.8 -  Total Bilirubin 0.0 - 1.2 mg/dL - 0.5 -  Alkaline Phos 44 - 121 IU/L - 64 -  AST 0 - 40 IU/L - 16 -  ALT 0 - 44 IU/L - 18 -    ASSESSMENT & PLAN Joseph Adkins is a 55 y.o. 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 Based on initial immunofixation, there is evidence of IgA monoclonal protein with kappa light chain specificity. Recommend full MGUS workup with CBC, CMP, multiple myeloma panel, kappa/lambda light chain, UPEP, beta 2 microglobulin and LDH levels. Bone Survey will be obtained to evaluate for lytic lesions. Plan to follow up with results by phone. If no additional intervention is required, we will plan to see patient back  in clinic in 3 months.   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  --Recommend further workup with CBC, CMP, multiple myeloma panel, kappa/lambda light chain, UPEP,beta 2 microglobulin and LDH levels.  --Need to obtain DG bone met surgery to evaluate for lytic lesions.  --Will consider bone marrow biopsy based on above workup --RTC in 3 months with labs unless further intervention is required.   Orders Placed This Encounter  Procedures  . DG Bone Survey Met    Standing Status:   Future    Standing Expiration Date:   09/03/2021    Order Specific Question:   Reason for Exam (SYMPTOM  OR DIAGNOSIS REQUIRED)    Answer:   monoclonal gammopathy, evaluate for lytic lesions.    Order Specific Question:   Preferred imaging location?    Answer:   [facility]  . CBC with Differential ([place] Only)    Standing Status:   Future    Number of Occurrences:   1    Standing Expiration Date:   09/03/2021  . CMP ([place] only)    Standing Status:   Future    Number of Occurrences:   1    Standing Expiration Date:   09/03/2021  . Lactate dehydrogenase (LDH)    Standing Status:   Future    Number of Occurrences:   1     Standing Expiration Date:   09/03/2021  . Beta 2 microglobulin    Standing Status:   Future    Number of Occurrences:   1    Standing Expiration Date:   09/03/2021  . Kappa/lambda light chains    Standing Status:   Future    Number of Occurrences:   1    Standing Expiration Date:   09/03/2021  . Multiple Myeloma Panel (SPEP&IFE w/QIG)    Standing Status:   Future    Number of Occurrences:   1    Standing Expiration Date:   09/03/2021  . 24-Hr Ur UPEP/UIFE/Light Chains/TP    Standing Status:   Future    Standing Expiration Date:   09/03/2021    All questions were answered. The patient knows to call the clinic with any problems, questions or concerns.  A total of more than 60 minutes were spent on this encounter and over half of that time was spent on counseling and coordination of care as outlined above.    Dede Query, PA-C Department of Hematology/Oncology [place] at [facility] Phone: [phone]

## 2020-09-04 ENCOUNTER — Other Ambulatory Visit: Payer: Self-pay

## 2020-09-04 ENCOUNTER — Inpatient Hospital Stay: Payer: Self-pay | Attending: Physician Assistant | Admitting: Physician Assistant

## 2020-09-04 ENCOUNTER — Encounter: Payer: Self-pay | Admitting: Physician Assistant

## 2020-09-04 ENCOUNTER — Inpatient Hospital Stay: Payer: No Typology Code available for payment source

## 2020-09-04 VITALS — BP 148/90 | HR 98 | Temp 97.0°F | Resp 17 | Ht 66.0 in | Wt 164.1 lb

## 2020-09-04 DIAGNOSIS — D472 Monoclonal gammopathy: Secondary | ICD-10-CM

## 2020-09-04 DIAGNOSIS — I10 Essential (primary) hypertension: Secondary | ICD-10-CM | POA: Insufficient documentation

## 2020-09-04 LAB — CBC WITH DIFFERENTIAL (CANCER CENTER ONLY)
Abs Immature Granulocytes: 0.01 10*3/uL (ref 0.00–0.07)
Basophils Absolute: 0 10*3/uL (ref 0.0–0.1)
Basophils Relative: 0 %
Eosinophils Absolute: 0.1 10*3/uL (ref 0.0–0.5)
Eosinophils Relative: 2 %
HCT: 39.3 % (ref 39.0–52.0)
Hemoglobin: 13.8 g/dL (ref 13.0–17.0)
Immature Granulocytes: 0 %
Lymphocytes Relative: 20 %
Lymphs Abs: 0.6 10*3/uL — ABNORMAL LOW (ref 0.7–4.0)
MCH: 32.9 pg (ref 26.0–34.0)
MCHC: 35.1 g/dL (ref 30.0–36.0)
MCV: 93.6 fL (ref 80.0–100.0)
Monocytes Absolute: 0.3 10*3/uL (ref 0.1–1.0)
Monocytes Relative: 10 %
Neutro Abs: 2 10*3/uL (ref 1.7–7.7)
Neutrophils Relative %: 68 %
Platelet Count: 322 10*3/uL (ref 150–400)
RBC: 4.2 MIL/uL — ABNORMAL LOW (ref 4.22–5.81)
RDW: 12.5 % (ref 11.5–15.5)
WBC Count: 3 10*3/uL — ABNORMAL LOW (ref 4.0–10.5)
nRBC: 0 % (ref 0.0–0.2)

## 2020-09-04 LAB — CMP (CANCER CENTER ONLY)
ALT: 16 U/L (ref 0–44)
AST: 17 U/L (ref 15–41)
Albumin: 4.1 g/dL (ref 3.5–5.0)
Alkaline Phosphatase: 53 U/L (ref 38–126)
Anion gap: 13 (ref 5–15)
BUN: 12 mg/dL (ref 6–20)
CO2: 22 mmol/L (ref 22–32)
Calcium: 8.7 mg/dL — ABNORMAL LOW (ref 8.9–10.3)
Chloride: 107 mmol/L (ref 98–111)
Creatinine: 1.16 mg/dL (ref 0.61–1.24)
GFR, Estimated: >60 mL/min (ref >60)
Glucose, Bld: 97 mg/dL (ref 70–99)
Potassium: 4.4 mmol/L (ref 3.5–5.1)
Sodium: 142 mmol/L (ref 135–145)
Total Bilirubin: 0.4 mg/dL (ref 0.3–1.2)
Total Protein: 8 g/dL (ref 6.5–8.1)

## 2020-09-04 LAB — LACTATE DEHYDROGENASE: LDH: 143 U/L (ref 98–192)

## 2020-09-05 LAB — BETA 2 MICROGLOBULIN, SERUM: Beta-2 Microglobulin: 1.4 mg/L (ref 0.6–2.4)

## 2020-09-07 ENCOUNTER — Telehealth: Payer: Self-pay | Admitting: Physician Assistant

## 2020-09-07 LAB — MULTIPLE MYELOMA PANEL, SERUM
Albumin SerPl Elph-Mcnc: 3.7 g/dL (ref 2.9–4.4)
Albumin/Glob SerPl: 1.1 (ref 0.7–1.7)
Alpha 1: 0.2 g/dL (ref 0.0–0.4)
Alpha2 Glob SerPl Elph-Mcnc: 0.6 g/dL (ref 0.4–1.0)
B-Globulin SerPl Elph-Mcnc: 1.2 g/dL (ref 0.7–1.3)
Gamma Glob SerPl Elph-Mcnc: 1.4 g/dL (ref 0.4–1.8)
Globulin, Total: 3.4 g/dL (ref 2.2–3.9)
IgA: 353 mg/dL (ref 90–386)
IgG (Immunoglobin G), Serum: 1485 mg/dL (ref 603–1613)
IgM (Immunoglobulin M), Srm: 94 mg/dL (ref 20–172)
Total Protein ELP: 7.1 g/dL (ref 6.0–8.5)

## 2020-09-07 LAB — KAPPA/LAMBDA LIGHT CHAINS
Kappa free light chain: 16.8 mg/L (ref 3.3–19.4)
Kappa, lambda light chain ratio: 1.16 (ref 0.26–1.65)
Lambda free light chains: 14.5 mg/L (ref 5.7–26.3)

## 2020-09-07 NOTE — Telephone Encounter (Signed)
Scheduled per los. Called and left msg. Confirmed appt

## 2020-09-14 LAB — UPEP/UIFE/LIGHT CHAINS/TP, 24-HR UR
% BETA, Urine: 31.3 %
ALPHA 1 URINE: 5.5 %
Albumin, U: 27.4 %
Alpha 2, Urine: 11.2 %
Free Kappa Lt Chains,Ur: 15.15 mg/L (ref 1.17–86.46)
Free Kappa/Lambda Ratio: 4.98 (ref 1.83–14.26)
Free Lambda Lt Chains,Ur: 3.04 mg/L (ref 0.27–15.21)
GAMMA GLOBULIN URINE: 24.5 %
Total Protein, Urine-Ur/day: 40 mg/24 hr (ref 30–150)
Total Protein, Urine: 4.7 mg/dL
Total Volume: 850

## 2020-09-29 ENCOUNTER — Other Ambulatory Visit: Payer: Self-pay

## 2020-09-29 ENCOUNTER — Ambulatory Visit (INDEPENDENT_AMBULATORY_CARE_PROVIDER_SITE_OTHER): Payer: Self-pay | Admitting: Neurology

## 2020-09-29 DIAGNOSIS — R202 Paresthesia of skin: Secondary | ICD-10-CM

## 2020-09-29 DIAGNOSIS — G5621 Lesion of ulnar nerve, right upper limb: Secondary | ICD-10-CM

## 2020-09-29 NOTE — Procedures (Signed)
[facility]  [place], [address]  [place], [place] [address] Tel: [phone] Fax:  [phone] Test Date:  09/29/2020  Patient: Joseph Adkins DOB: 02-28-1966 Physician: Narda Amber, DO  Sex: Male Height: 5\' 6"  Ref Phys: Metta Clines, D.O.  ID#: [id number]   Technician:    Patient Complaints: This is a 55 year old man referred for evaluation of hand and feet paresthesia.  NCV & EMG Findings: Extensive electrodiagnostic testing of the right upper extremity shows:  1. Right median, ulnar, mixed palmar, sural, and superficial peroneal sensory responses are within normal limits. 2. Right ulnar motor response shows slowed conduction velocity across the elbow (A Elbow-B Elbow, 38 m/s).  Right median, peroneal, and tibial motor responses are within normal limits.   3. Right tibial H reflex study is within normal limits.   4. There is no evidence of active or chronic motor axonal loss changes affecting any of the tested muscles.  Motor unit configuration and recruitment pattern is within normal limits.    Impression: 1. Right ulnar neuropathy with slowing across the elbow, purely demyelinating, mild. 2. There is no evidence of carpal tunnel syndrome, cervical/lumbosacral radiculopathy, or sensorimotor polyneuropathy affecting the right side.   ___________________________ Narda Amber, DO    Nerve Conduction Studies Anti Sensory Summary Table   Stim Site NR Peak (ms) Norm Peak (ms) P-T Amp (V) Norm P-T Amp  Right Median Anti Sensory (2nd Digit)  32C  Wrist    3.1 <3.6 26.4 >15  Right Sup Peroneal Anti Sensory (Ant Lat Mall)  32C  12 cm    2.6 <4.6 15.1 >4  Right Sural Anti Sensory (Lat Mall)  32C  Calf    2.5 <4.6 23.7 >4  Right Ulnar Anti Sensory (5th Digit)  32C  Wrist    2.8 <3.1 16.3 >10   Motor Summary Table   Stim Site NR Onset (ms) Norm Onset (ms) O-P Amp (mV) Norm O-P Amp Site1 Site2 Delta-0 (ms) Dist (cm) Vel (m/s) Norm Vel (m/s)  Right Median  Motor (Abd Poll Brev)  32C  Wrist    3.2 <4.0 8.0 >6 Elbow Wrist 4.9 28.0 57 >50  Elbow    8.1  7.6         Right Peroneal Motor (Ext Dig Brev)  32C  Ankle    3.7 <6.0 4.5 >2.5 B Fib Ankle 7.3 38.0 52 >40  B Fib    11.0  4.1  Poplt B Fib 1.4 9.0 64 >40  Poplt    12.4  3.9         Right Tibial Motor (Abd Hall Brev)  32C  Ankle    4.7 <6.0 14.3 >4 Knee Ankle 7.2 42.0 58 >40  Knee    11.9  9.8         Right Ulnar Motor (Abd Dig Minimi)  32C  Wrist    2.4 <3.1 13.0 >7 B Elbow Wrist 4.0 25.0 63 >50  B Elbow    6.4  12.2  A Elbow B Elbow 2.6 10.0 38 >50  A Elbow    9.0  11.4          Comparison Summary Table   Stim Site NR Peak (ms) Norm Peak (ms) P-T Amp (V) Site1 Site2 Delta-P (ms) Norm Delta (ms)  Right Median/Ulnar Palm Comparison (Wrist - 8cm)  32C  Median Palm    1.9 <2.2 23.6 Median Palm Ulnar Palm 0.1   Ulnar Palm    1.8 <2.2 8.4  H Reflex Studies   NR H-Lat (ms) Lat Norm (ms) L-R H-Lat (ms)  Right Tibial (Gastroc)  32C     29.66 <35    EMG   Side Muscle Ins Act Fibs Psw Fasc Number Recrt Dur Dur. Amp Amp. Poly Poly. Comment  Right AntTibialis Nml Nml Nml Nml Nml Nml Nml Nml Nml Nml Nml Nml N/A  Right Gastroc Nml Nml Nml Nml Nml Nml Nml Nml Nml Nml Nml Nml N/A  Right Flex Dig Long Nml Nml Nml Nml Nml Nml Nml Nml Nml Nml Nml Nml N/A  Right RectFemoris Nml Nml Nml Nml Nml Nml Nml Nml Nml Nml Nml Nml N/A  Right GluteusMed Nml Nml Nml Nml Nml Nml Nml Nml Nml Nml Nml Nml N/A  Right 1stDorInt Nml Nml Nml Nml Nml Nml Nml Nml Nml Nml Nml Nml N/A  Right PronatorTeres Nml Nml Nml Nml Nml Nml Nml Nml Nml Nml Nml Nml N/A  Right Biceps Nml Nml Nml Nml Nml Nml Nml Nml Nml Nml Nml Nml N/A  Right Triceps Nml Nml Nml Nml Nml Nml Nml Nml Nml Nml Nml Nml N/A  Right Deltoid Nml Nml Nml Nml Nml Nml Nml Nml Nml Nml Nml Nml N/A  Right ABD Dig Min Nml Nml Nml Nml Nml Nml Nml Nml Nml Nml Nml Nml N/A  Right FlexDigProf 4,5 Nml Nml Nml Nml Nml Nml Nml Nml Nml Nml Nml Nml N/A       Waveforms:

## 2020-10-02 ENCOUNTER — Other Ambulatory Visit: Payer: Self-pay

## 2020-10-02 ENCOUNTER — Ambulatory Visit (HOSPITAL_COMMUNITY)
Admission: RE | Admit: 2020-10-02 | Discharge: 2020-10-02 | Disposition: A | Discharge status: Home / Self Care | Payer: No Typology Code available for payment source | Source: Ambulatory Visit | Attending: Physician Assistant | Admitting: Physician Assistant

## 2020-10-02 DIAGNOSIS — D472 Monoclonal gammopathy: Secondary | ICD-10-CM

## 2020-10-07 ENCOUNTER — Telehealth: Payer: Self-pay | Admitting: Physician Assistant

## 2020-10-07 NOTE — Telephone Encounter (Signed)
I have tried to reach Mr. Krise several times and have left a voicemail to call me back. The purpose of the call is to review the results from the MGUS workup. There is evidence of IgA monoclonal protein with kappa light chain specificity. Kappa/lambda light chains, UPEP and bone survey were unremarkable. Recommend to closely monitor and return 3 months from last visit to repeat labs.

## 2020-11-02 ENCOUNTER — Encounter: Payer: Self-pay | Admitting: Family Medicine

## 2020-11-02 ENCOUNTER — Other Ambulatory Visit: Payer: Self-pay

## 2020-11-02 ENCOUNTER — Ambulatory Visit: Payer: Self-pay | Attending: Family Medicine | Admitting: Family Medicine

## 2020-11-02 VITALS — BP 144/79 | HR 98 | Ht 66.0 in | Wt 164.0 lb

## 2020-11-02 DIAGNOSIS — I10 Essential (primary) hypertension: Secondary | ICD-10-CM

## 2020-11-02 DIAGNOSIS — D472 Monoclonal gammopathy: Secondary | ICD-10-CM

## 2020-11-02 DIAGNOSIS — R202 Paresthesia of skin: Secondary | ICD-10-CM

## 2020-11-02 DIAGNOSIS — G629 Polyneuropathy, unspecified: Secondary | ICD-10-CM

## 2020-11-02 MED ORDER — AMLODIPINE BESYLATE 5 MG PO TABS: ORAL_TABLET | ORAL | 6 refills | Status: DC

## 2020-11-02 MED ORDER — GABAPENTIN 300 MG PO CAPS: 300.0000 mg | ORAL_CAPSULE | Freq: Every day | ORAL | 3 refills | Status: AC

## 2020-11-02 MED ORDER — LOSARTAN POTASSIUM 100 MG PO TABS: ORAL_TABLET | ORAL | 6 refills | Status: DC

## 2020-11-02 NOTE — Patient Instructions (Signed)
Paresthesia Paresthesia is a burning or prickling feeling. This feeling can happen in any part of the body. It often happens in the hands, arms, legs, or feet. Usually, it is not painful. In most cases, the feeling goes away in a short time and is not a sign of a serious problem. If you have paresthesia that lasts a long time, you need to be seen by your doctor. Follow these instructions at home: Alcohol use  Do not drink alcohol if: ? Your doctor tells you not to drink. ? You are pregnant, may be pregnant, or are planning to become pregnant.  If you drink alcohol: ? Limit how much you use to:  0-1 drink a day for women.  0-2 drinks a day for men. ? Be aware of how much alcohol is in your drink. In the [place] one drink equals one 12 oz bottle of beer (355 mL), one 5 oz glass of wine (148 mL), or one 1 oz glass of hard liquor (44 mL).   Nutrition  Eat a healthy diet. This includes: ? Eating foods that have a lot of fiber in them, such as fresh fruits and vegetables, whole grains, and beans. ? Limiting foods that have a lot of fat and processed sugars in them, such as fried or sweet foods.   General instructions  Take over-the-counter and prescription medicines only as told by your doctor.  Do not use any products that have nicotine or tobacco in them, such as cigarettes and e-cigarettes. If you need help quitting, ask your doctor.  If you have diabetes, work with your doctor to make sure your blood sugar stays in a healthy range.  If your feet feel numb: ? Check for redness, warmth, and swelling every day. ? Wear padded socks and comfortable shoes. These help protect your feet.  Keep all follow-up visits as told by your doctor. This is important. Contact a doctor if:  You have paresthesia that gets worse or does not go away.  Lose feeling (have numbness) after an injury.  Your burning or prickling feeling gets worse when you walk.  You have pain or cramps.  You feel dizzy  or pass out (faint).  You have a rash. Get help right away if you:  Feel weak or have new weakness in an arm or leg.  Have trouble walking or moving.  Have problems speaking, understanding, or seeing.  Feel confused.  Cannot control when you pee (urinate) or poop (have a bowel movement). Summary  Paresthesia is a burning or prickling feeling. It often happens in the hands, arms, legs, or feet.  In most cases, the feeling goes away in a short time and is not a sign of a serious problem.  If you have paresthesia that lasts a long time, you need to be seen by your doctor. This information is not intended to replace advice given to you by your health care provider. Make sure you discuss any questions you have with your health care provider. Document Revised: 02/25/2020 Document Reviewed: 02/25/2020 Elsevier Patient Education  2021 [employer].

## 2020-11-02 NOTE — Progress Notes (Signed)
Subjective:  Patient ID: Joseph Adkins, male    DOB: 1965-09-13  Age: 55 y.o. MRN: [id number]  CC: Hypertension   HPI Joseph Adkins is a 55 year old male with a history of hypertension here for chronic disease management. At his last visit he had been referred to neurology for evaluation of chronic dizziness after cardiac work-up had been negative.  Interval History: Seen by neurology, 3 months ago with no neurologic causes identified.  He underwent EMG by neurology which revealed: Impression: 1. Right ulnar neuropathy with slowing across the elbow, purely demyelinating, mild. 2. There is no evidence of carpal tunnel syndrome, cervical/lumbosacral radiculopathy, or sensorimotor polyneuropathy affecting the right side.  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  CT Angio Head: IMPRESSION: 1. No stenotic disease to implicate vertebrobasilar insufficiency. 2. 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  8 days ago he walked to work and his legs felt weak and he felt dizzy   It lasted 5 hours and he will take intermittent breaks at work to sit.  He denied heat as a possible etiology. 3 days ago he was siting to have lunch with his friends and symptoms commenced with wobbly legs starting out for which he got up and walked around intermittently and symptoms resolved. His legs are tingly at the moment but he has no pain. He is compliant with his antihypertensive.  Denies presence of headaches, blurry vision, myalgias or arthralgias and has no back pain. Past Medical History:  Diagnosis Date  . Hypertension     Past Surgical History:  Procedure Laterality Date  . none      Family History  Problem Relation Age of Onset  . Diabetes Mother   . Hypertension Mother   . Diabetes  Maternal Grandmother   . Diabetes Maternal Grandfather   . Hypertension Maternal Grandfather   . Hyperlipidemia Maternal Grandfather   . Diabetes Paternal Grandmother   . Diabetes Paternal Grandfather   . Hypertension Paternal Grandfather     No Known Allergies  Outpatient Medications Prior to Visit  Medication Sig Dispense Refill  . amLODipine (NORVASC) 5 MG tablet TAKE 1 TABLET(5 MG) BY MOUTH DAILY 30 tablet 6  . Flaxseed, Linseed, (FLAX SEED OIL) 1000 MG CAPS Take 1,000 mg by mouth daily.    Marland Kitchen losartan (COZAAR) 100 MG tablet TAKE 1 TABLET(100 MG) BY MOUTH DAILY 60 tablet 6   No facility-administered medications prior to visit.     ROS Review of Systems  Constitutional: Negative for activity change and appetite change.  HENT: Negative for sinus pressure and sore throat.   Eyes: Negative for visual disturbance.  Respiratory: Negative for cough, chest tightness and shortness of breath.   Cardiovascular: Negative for chest pain and leg swelling.  Gastrointestinal: Negative for abdominal distention, abdominal pain, constipation and diarrhea.  Endocrine: Negative.   Genitourinary: Negative for dysuria.  Musculoskeletal: Negative for joint swelling and myalgias.  Skin: Negative for rash.  Allergic/Immunologic: Negative.   Neurological: Negative for weakness, light-headedness and numbness.  Psychiatric/Behavioral: Negative for dysphoric mood and suicidal ideas.    Objective:  BP (!) 144/79   Pulse 98   Ht 5\' 6"  (1.676 m)   Wt 164 lb (74.4 kg)   SpO2 99%   BMI 26.47 kg/m   BP/Weight 11/02/2020 11/04/3417 10/30/2295  Systolic BP 989 211  811  Diastolic BP 79 90 93  Wt. (Lbs) 164 164.1 167.8  BMI 26.47 26.49 27.08      Physical Exam Constitutional:      Appearance: He is well-developed.  Neck:     Vascular: No JVD.  Cardiovascular:     Rate and Rhythm: Normal rate.     Heart sounds: Normal heart sounds. No murmur heard.   Pulmonary:     Effort: Pulmonary effort is  normal.     Breath sounds: Normal breath sounds. No wheezing or rales.  Chest:     Chest wall: No tenderness.  Abdominal:     General: Bowel sounds are normal. There is no distension.     Palpations: Abdomen is soft. There is no mass.     Tenderness: There is no abdominal tenderness.  Musculoskeletal:        General: Normal range of motion.     Right lower leg: No edema.     Left lower leg: No edema.  Neurological:     Mental Status: He is alert and oriented to person, place, and time.     Deep Tendon Reflexes:     Reflex Scores:      Patellar reflexes are 3+ on the right side and 2+ on the left side. Psychiatric:        Mood and Affect: Mood normal.     CMP Latest Ref Rng & Units 09/04/2020 08/05/2020 03/20/2020  Glucose 70 - 99 mg/dL 97 - -  BUN 6 - 20 mg/dL 12 - -  Creatinine 0.61 - 1.24 mg/dL 1.16 - -  Sodium 135 - 145 mmol/L 142 - -  Potassium 3.5 - 5.1 mmol/L 4.4 - -  Chloride 98 - 111 mmol/L 107 - -  CO2 22 - 32 mmol/L 22 - -  Calcium 8.9 - 10.3 mg/dL 8.7(L) - -  Total Protein 6.5 - 8.1 g/dL 8.0 7.6 7.8  Total Bilirubin 0.3 - 1.2 mg/dL 0.4 - 0.5  Alkaline Phos 38 - 126 U/L 53 - 64  AST 15 - 41 U/L 17 - 16  ALT 0 - 44 U/L 16 - 18    Lipid Panel     Component Value Date/Time   CHOL 229 (H) 03/20/2020 1030   TRIG 40 03/20/2020 1030   HDL 87 03/20/2020 1030   CHOLHDL 2.6 03/20/2020 1030   CHOLHDL 2.7 06/23/2015 0942   VLDL 9 06/23/2015 0942   LDLCALC 135 (H) 03/20/2020 1030    CBC    Component Value Date/Time   WBC 3.0 (L) 09/04/2020 0958   WBC 4.9 11/18/2019 1539   RBC 4.20 (L) 09/04/2020 0958   HGB 13.8 09/04/2020 0958   HCT 39.3 09/04/2020 0958   PLT 322 09/04/2020 0958   MCV 93.6 09/04/2020 0958   MCH 32.9 09/04/2020 0958   MCHC 35.1 09/04/2020 0958   RDW 12.5 09/04/2020 0958   LYMPHSABS 0.6 (L) 09/04/2020 0958   MONOABS 0.3 09/04/2020 0958   EOSABS 0.1 09/04/2020 0958   BASOSABS 0.0 09/04/2020 0958    Lab Results  Component Value Date    HGBA1C 5.2 03/20/2020    Assessment & Plan:  1. Essential hypertension Controlled Continue antihypertensive regimen Counseled on blood pressure goal of less than 130/80, low-sodium, DASH diet, medication compliance, 150 minutes of moderate intensity exercise per week. Discussed medication compliance, adverse effects. - amLODipine (NORVASC) 5 MG tablet; TAKE 1 TABLET(5 MG) BY MOUTH DAILY  Dispense: 30 tablet; Refill: 6 - losartan (COZAAR) 100  MG tablet; TAKE 1 TABLET(100 MG) BY MOUTH DAILY  Dispense: 60 tablet; Refill: 6  2. MGUS (monoclonal gammopathy of unknown significance) Bone scan unremarkable Follow-up with oncology  3. Paresthesia Unknown etiology Given presence of demyelination in nerve conduction studies I will order MRI of his lumbar spine to evaluate for MS Counseled on adverse effects of gabapentin that we will commence low-dose gabapentin at bedtime - gabapentin (NEURONTIN) 300 MG capsule; Take 1 capsule (300 mg total) by mouth at bedtime.  Dispense: 30 capsule; Refill: 3  4. Demyelination present on biopsy of peripheral nerve He does have hyperreflexia of his right patellar reflex which is concerning especially in the light of his lower extremity paresthesia We will need to exclude multiple sclerosis given finding of demyelination and nerve conduction studies. - MR Lumbar Spine Wo Contrast; Future   No orders of the defined types were placed in this encounter.   Follow-up: No follow-ups on file.       Charlott Rakes, MD, FAAFP. [facility] [place], [place]   11/02/2020, 4:48 PM

## 2020-11-03 ENCOUNTER — Telehealth: Payer: Self-pay | Admitting: Family Medicine

## 2020-11-03 NOTE — Telephone Encounter (Signed)
Can you please inform him I ordered an MRI of his lumbar spine to further evaluate the lower extremity weakness and numbness.  Can you please schedule this for him?  Thank you

## 2020-11-04 NOTE — Telephone Encounter (Signed)
Patient was called and a Vm was left informing patient to return phone call for appointment details.

## 2020-11-13 ENCOUNTER — Ambulatory Visit (HOSPITAL_COMMUNITY)
Admission: RE | Admit: 2020-11-13 | Discharge: 2020-11-13 | Disposition: A | Discharge status: Home / Self Care | Payer: Self-pay | Source: Ambulatory Visit | Attending: Family Medicine | Admitting: Family Medicine

## 2020-11-13 ENCOUNTER — Other Ambulatory Visit: Payer: Self-pay

## 2020-11-13 DIAGNOSIS — G629 Polyneuropathy, unspecified: Secondary | ICD-10-CM | POA: Insufficient documentation

## 2020-11-16 ENCOUNTER — Other Ambulatory Visit: Payer: Self-pay | Admitting: Family Medicine

## 2020-11-16 DIAGNOSIS — M48062 Spinal stenosis, lumbar region with neurogenic claudication: Secondary | ICD-10-CM

## 2020-11-17 ENCOUNTER — Telehealth: Payer: Self-pay

## 2020-11-17 NOTE — Telephone Encounter (Signed)
Pt was called and a VM was left informing patient of lab results.   Letter will be mailed.

## 2020-11-17 NOTE — Telephone Encounter (Signed)
-----   Message from Charlott Rakes, MD sent at 11/16/2020 10:30 AM EDT ----- Please inform him his MRI reveals narrowing of his spine which can cause the leg weakness he is experiencing. I have referred him to a back doctor.

## 2020-12-03 ENCOUNTER — Encounter: Payer: Self-pay | Admitting: Physical Medicine & Rehabilitation

## 2020-12-07 ENCOUNTER — Inpatient Hospital Stay: Payer: No Typology Code available for payment source

## 2020-12-07 ENCOUNTER — Inpatient Hospital Stay: Payer: No Typology Code available for payment source | Admitting: Hematology and Oncology

## 2020-12-07 ENCOUNTER — Telehealth: Payer: Self-pay | Admitting: Hematology and Oncology

## 2020-12-07 NOTE — Progress Notes (Signed)
No show

## 2020-12-07 NOTE — Telephone Encounter (Signed)
Rescheduled appointment per 07/08 los. Left message.

## 2020-12-23 ENCOUNTER — Inpatient Hospital Stay: Payer: No Typology Code available for payment source | Attending: Physician Assistant

## 2020-12-23 ENCOUNTER — Inpatient Hospital Stay: Payer: No Typology Code available for payment source | Admitting: Hematology and Oncology

## 2020-12-23 ENCOUNTER — Other Ambulatory Visit: Payer: Self-pay | Admitting: Hematology and Oncology

## 2020-12-23 DIAGNOSIS — D472 Monoclonal gammopathy: Secondary | ICD-10-CM

## 2020-12-23 NOTE — Progress Notes (Deleted)
[facility] [phone]   [phone]  PROGRESS NOTE  Patient Care Team: Charlott Rakes, MD as PCP - General (Family Medicine)  Hematological/Oncological History # ***  Interval History:  Joseph Adkins 55 y.o. male with medical history significant for *** presents for a follow up visit. The patient's last visit was on ***. In the interim since the last visit ***  MEDICAL HISTORY:  Past Medical History:  Diagnosis Date   Hypertension     SURGICAL HISTORY: Past Surgical History:  Procedure Laterality Date   none      SOCIAL HISTORY: Social History   Socioeconomic History   Marital status: Single    Spouse name: Not on file   Number of children: Not on file   Years of education: Not on file   Highest education level: Not on file  Occupational History   Occupation: Administrator, arts    Employer: [employer]  Tobacco Use   Smoking status: Never   Smokeless tobacco: Never  Substance and Sexual Activity   Alcohol use: Yes    Alcohol/week: 0.0 standard drinks    Comment: 16-20 beers per week   Drug use: No   Sexual activity: Not Currently  Other Topics Concern   Not on file  Social History Narrative   Right handed   Social Determinants of Health   Financial Resource Strain: Not on file  Food Insecurity: Not on file  Transportation Needs: Not on file  Physical Activity: Not on file  Stress: Not on file  Social Connections: Not on file  Intimate Partner Violence: Not on file    FAMILY HISTORY: Family History  Problem Relation Age of Onset   Diabetes Mother    Hypertension Mother    Diabetes Maternal Grandmother    Diabetes Maternal Grandfather    Hypertension Maternal Grandfather    Hyperlipidemia Maternal Grandfather    Diabetes Paternal Grandmother    Diabetes Paternal Grandfather    Hypertension Paternal Grandfather     ALLERGIES:  has No Known Allergies.  MEDICATIONS:  Current Outpatient Medications   Medication Sig Dispense Refill   amLODipine (NORVASC) 5 MG tablet TAKE 1 TABLET(5 MG) BY MOUTH DAILY 30 tablet 6   Flaxseed, Linseed, (FLAX SEED OIL) 1000 MG CAPS Take 1,000 mg by mouth daily.     gabapentin (NEURONTIN) 300 MG capsule Take 1 capsule (300 mg total) by mouth at bedtime. 30 capsule 3   losartan (COZAAR) 100 MG tablet TAKE 1 TABLET(100 MG) BY MOUTH DAILY 60 tablet 6   No current facility-administered medications for this visit.    REVIEW OF SYSTEMS:   Constitutional: ( - ) fevers, ( - )  chills , ( - ) night sweats Eyes: ( - ) blurriness of vision, ( - ) double vision, ( - ) watery eyes Ears, nose, mouth, throat, and face: ( - ) mucositis, ( - ) sore throat Respiratory: ( - ) cough, ( - ) dyspnea, ( - ) wheezes Cardiovascular: ( - ) palpitation, ( - ) chest discomfort, ( - ) lower extremity swelling Gastrointestinal:  ( - ) nausea, ( - ) heartburn, ( - ) change in bowel habits Skin: ( - ) abnormal skin rashes Lymphatics: ( - ) new lymphadenopathy, ( - ) easy bruising Neurological: ( - ) numbness, ( - ) tingling, ( - ) new weaknesses Behavioral/Psych: ( - ) mood change, ( - ) new changes  All other systems were reviewed with the patient and are  negative.  PHYSICAL EXAMINATION: ECOG PERFORMANCE STATUS: {CHL ONC ECOG [id number]}  There were no vitals filed for this visit. There were no vitals filed for this visit.  GENERAL: alert, no distress and comfortable SKIN: skin color, texture, turgor are normal, no rashes or significant lesions EYES: conjunctiva are pink and non-injected, sclera clear OROPHARYNX: no exudate, no erythema; lips, buccal mucosa, and tongue normal  NECK: supple, non-tender LYMPH:  no palpable lymphadenopathy in the cervical, axillary or inguinal LUNGS: clear to auscultation and percussion with normal breathing effort HEART: regular rate & rhythm and no murmurs and no lower extremity edema ABDOMEN: soft, non-tender, non-distended, normal bowel  sounds Musculoskeletal: no cyanosis of digits and no clubbing  PSYCH: alert & oriented x 3, fluent speech NEURO: no focal motor/sensory deficits  LABORATORY DATA:  I have reviewed the data as listed CBC Latest Ref Rng & Units 09/04/2020 11/18/2019 10/24/2019  WBC 4.0 - 10.5 K/uL 3.0(L) 4.9 5.0  Hemoglobin 13.0 - 17.0 g/dL 13.8 13.1 13.5  Hematocrit 39.0 - 52.0 % 39.3 38.7(L) 39.1  Platelets 150 - 400 K/uL 322 276 301    CMP Latest Ref Rng & Units 09/04/2020 08/05/2020 03/20/2020  Glucose 70 - 99 mg/dL 97 - -  BUN 6 - 20 mg/dL 12 - -  Creatinine 0.61 - 1.24 mg/dL 1.16 - -  Sodium 135 - 145 mmol/L 142 - -  Potassium 3.5 - 5.1 mmol/L 4.4 - -  Chloride 98 - 111 mmol/L 107 - -  CO2 22 - 32 mmol/L 22 - -  Calcium 8.9 - 10.3 mg/dL 8.7(L) - -  Total Protein 6.5 - 8.1 g/dL 8.0 7.6 7.8  Total Bilirubin 0.3 - 1.2 mg/dL 0.4 - 0.5  Alkaline Phos 38 - 126 U/L 53 - 64  AST 15 - 41 U/L 17 - 16  ALT 0 - 44 U/L 16 - 18    Lab Results  Component Value Date   MPROTEIN Comment (A) 09/04/2020   Lab Results  Component Value Date   KPAFRELGTCHN 16.8 09/04/2020   LAMBDASER 14.5 09/04/2020   KAPLAMBRATIO 4.98 09/11/2020   KAPLAMBRATIO 1.16 09/04/2020     BLOOD FILM: *** Review of the peripheral blood smear showed normal appearing white cells with neutrophils that were appropriately lobated and granulated. There was no predominance of bi-lobed or hyper-segmented neutrophils appreciated. No Dohle bodies were noted. There was no left shifting, immature forms or blasts noted. Lymphocytes remain normal in size without any predominance of large granular lymphocytes. Red cells show no anisopoikilocytosis, macrocytes , microcytes or polychromasia. There were no schistocytes, target cells, echinocytes, acanthocytes, dacrocytes, or stomatocytes.There was no rouleaux formation, nucleated red cells, or intra-cellular inclusions noted. The platelets are normal in size, shape, and color without any clumping  evident.  RADIOGRAPHIC STUDIES: I have personally reviewed the radiological images as listed and agreed with the findings in the report. No results found.  ASSESSMENT & PLAN ***  No orders of the defined types were placed in this encounter.   All questions were answered. The patient knows to call the clinic with any problems, questions or concerns.  A total of more than {CHL ONC TIME VISIT - [id number] were spent on this encounter with face-to-face time and non-face-to-face time, including preparing to see the patient, ordering tests and/or medications, counseling the patient and coordination of care as outlined above.   Ledell Peoples, MD Department of Hematology/Oncology [place] at [facility] Phone: [phone] Pager: [phone] Email: [email or web address]  12/23/2020 2:03 PM

## 2020-12-29 ENCOUNTER — Telehealth: Payer: Self-pay | Admitting: Physician Assistant

## 2020-12-29 NOTE — Telephone Encounter (Signed)
Scheduled appts per 8/1 sch msg. Called pt, no answer. Left msg with appts date and time.

## 2021-01-11 ENCOUNTER — Other Ambulatory Visit: Payer: Self-pay | Admitting: Physician Assistant

## 2021-01-12 ENCOUNTER — Inpatient Hospital Stay: Payer: No Typology Code available for payment source | Admitting: Physician Assistant

## 2021-01-12 ENCOUNTER — Inpatient Hospital Stay: Payer: No Typology Code available for payment source

## 2021-01-13 ENCOUNTER — Telehealth: Payer: Self-pay | Admitting: Physician Assistant

## 2021-01-13 NOTE — Telephone Encounter (Signed)
Scheduled appointment per 08/16 sch msg. Left message.

## 2021-01-19 ENCOUNTER — Encounter: Payer: Self-pay | Attending: Physical Medicine & Rehabilitation | Admitting: Physical Medicine & Rehabilitation

## 2021-01-20 ENCOUNTER — Inpatient Hospital Stay: Payer: No Typology Code available for payment source | Attending: Physician Assistant

## 2021-01-20 ENCOUNTER — Telehealth: Payer: Self-pay | Admitting: Physician Assistant

## 2021-01-20 ENCOUNTER — Inpatient Hospital Stay: Payer: No Typology Code available for payment source | Admitting: Physician Assistant

## 2021-01-20 NOTE — Telephone Encounter (Signed)
Called pt to r/s appts per 8/24 sch msg. No answer. Per sch msg I did not r/s appts yet as pt did not answer the phone. I left a msg for the pt to call back to r/s appts. I left my phone number for pt to contact.

## 2021-02-09 NOTE — Progress Notes (Deleted)
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  UPDATE: For workup of dizziness:  CTA of head on 08/28/2020 personally reviewed showed intracranial atherosclerosis with mild to moderate left distal M1 segment stenosis but no vertebrobasilar stenosis.  For workup of neuropathy:  Neuropathy labs on 08/05/2020 showed negative ANA, TSH 3.86, ACE 24, normal SPEP, B12 was 278 and was recommended to start OTC 1024mg daily.  SPEP was normal but IFE showed IgA monoclonal protein with kappa light chain.  He was referred to heme-onc where bone scan was unremarkable and is being monitored for MGUS.  NCV-EMG of right upper and lower extremities on 09/29/2020 showed incidental mild right ulnar neuropathy at the elbow but no evidence of a polyneuropathy or radiculopathy.  He endorsed weakness in the legs.  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  He was referred to spine surgeon ***   HISTORY: Since 2018, he reports unsteady gait, fatigue and episodic dizziness.  It started after he was placed on a blood pressure medication.  He feels like he is going to pass out.  Symptoms resolve at rest.  Work up at that time was unremarkable, including echocardiogram, nuclear stress test, carotid ultrasound and CT head.  Blood pressures at home typically run in the 130s/80s.  EKG showed no acute changes.  Orthostatic vitals have previously been negative.     Within the past year, he reports  tingling in the hands sometimes radiating up  the arms. Sometimes his hands cramp lock up at times.  He also reports tingling in his feet.  Denies pain in neck and back.  Denies radicular pain down arms and legs.  Denies extremity weakness.  Hgb A1c in October 2021 was 5.2.    PAST MEDICAL HISTORY: Past Medical History:  Diagnosis Date   Hypertension     MEDICATIONS: Current Outpatient Medications on File Prior to Visit  Medication Sig Dispense Refill   amLODipine (NORVASC) 5 MG tablet TAKE 1 TABLET(5 MG) BY MOUTH DAILY 30 tablet 6   Flaxseed, Linseed, (FLAX SEED OIL) 1000 MG CAPS Take 1,000 mg by mouth daily.     gabapentin (NEURONTIN) 300 MG capsule Take 1 capsule (300 mg total) by mouth at bedtime. 30 capsule 3   losartan (COZAAR) 100 MG tablet TAKE 1 TABLET(100 MG) BY MOUTH DAILY 60 tablet 6   No current facility-administered medications on file prior to visit.    ALLERGIES: No Known Allergies  FAMILY HISTORY: Family History  Problem Relation Age of Onset   Diabetes Mother    Hypertension Mother    Diabetes Maternal Grandmother    Diabetes Maternal Grandfather    Hypertension Maternal Grandfather    Hyperlipidemia Maternal Grandfather    Diabetes Paternal Grandmother    Diabetes Paternal Grandfather    Hypertension Paternal Grandfather       Objective:  *** General: No acute distress.  Patient appears ***-groomed.   Head:  Normocephalic/atraumatic Eyes:  Fundi examined but not visualized Neck: supple, no paraspinal tenderness, full range of motion Heart:  Regular  rate and rhythm Lungs:  Clear to auscultation bilaterally Back: No paraspinal tenderness Neurological Exam: alert and oriented to person, place, and time.  Speech fluent and not dysarthric, language intact.  CN II-XII intact. Bulk and tone normal, muscle strength 5/5 throughout.  Sensation to light touch intact.  Deep tendon reflexes 2+ throughout, toes downgoing.  Finger to nose testing intact.  Gait normal, Romberg negative.   Metta Clines, DO  CC:  ***

## 2021-02-10 ENCOUNTER — Ambulatory Visit: Payer: Self-pay | Admitting: Neurology

## 2021-03-16 ENCOUNTER — Inpatient Hospital Stay: Payer: No Typology Code available for payment source | Attending: Physician Assistant

## 2021-03-16 ENCOUNTER — Other Ambulatory Visit: Payer: Self-pay

## 2021-03-16 ENCOUNTER — Inpatient Hospital Stay (HOSPITAL_BASED_OUTPATIENT_CLINIC_OR_DEPARTMENT_OTHER): Payer: Self-pay | Admitting: Physician Assistant

## 2021-03-16 VITALS — BP 140/95 | HR 87 | Temp 98.3°F | Resp 19 | Ht 66.0 in | Wt 168.2 lb

## 2021-03-16 DIAGNOSIS — D472 Monoclonal gammopathy: Secondary | ICD-10-CM | POA: Insufficient documentation

## 2021-03-16 DIAGNOSIS — I10 Essential (primary) hypertension: Secondary | ICD-10-CM | POA: Insufficient documentation

## 2021-03-16 LAB — CBC WITH DIFFERENTIAL (CANCER CENTER ONLY)
Abs Immature Granulocytes: 0.01 10*3/uL (ref 0.00–0.07)
Basophils Absolute: 0 10*3/uL (ref 0.0–0.1)
Basophils Relative: 0 %
Eosinophils Absolute: 0.1 10*3/uL (ref 0.0–0.5)
Eosinophils Relative: 3 %
HCT: 39 % (ref 39.0–52.0)
Hemoglobin: 13.9 g/dL (ref 13.0–17.0)
Immature Granulocytes: 0 %
Lymphocytes Relative: 25 %
Lymphs Abs: 1.2 10*3/uL (ref 0.7–4.0)
MCH: 32.5 pg (ref 26.0–34.0)
MCHC: 35.6 g/dL (ref 30.0–36.0)
MCV: 91.1 fL (ref 80.0–100.0)
Monocytes Absolute: 0.6 10*3/uL (ref 0.1–1.0)
Monocytes Relative: 13 %
Neutro Abs: 2.7 10*3/uL (ref 1.7–7.7)
Neutrophils Relative %: 59 %
Platelet Count: 306 10*3/uL (ref 150–400)
RBC: 4.28 MIL/uL (ref 4.22–5.81)
RDW: 12.3 % (ref 11.5–15.5)
WBC Count: 4.6 10*3/uL (ref 4.0–10.5)
nRBC: 0 % (ref 0.0–0.2)

## 2021-03-16 LAB — CMP (CANCER CENTER ONLY)
ALT: 17 U/L (ref 0–44)
AST: 18 U/L (ref 15–41)
Albumin: 4.2 g/dL (ref 3.5–5.0)
Alkaline Phosphatase: 53 U/L (ref 38–126)
Anion gap: 11 (ref 5–15)
BUN: 13 mg/dL (ref 6–20)
CO2: 22 mmol/L (ref 22–32)
Calcium: 9.4 mg/dL (ref 8.9–10.3)
Chloride: 106 mmol/L (ref 98–111)
Creatinine: 1.16 mg/dL (ref 0.61–1.24)
GFR, Estimated: >60 mL/min (ref >60)
Glucose, Bld: 106 mg/dL — ABNORMAL HIGH (ref 70–99)
Potassium: 3.7 mmol/L (ref 3.5–5.1)
Sodium: 139 mmol/L (ref 135–145)
Total Bilirubin: 0.8 mg/dL (ref 0.3–1.2)
Total Protein: 8 g/dL (ref 6.5–8.1)

## 2021-03-16 LAB — LACTATE DEHYDROGENASE: LDH: 174 U/L (ref 98–192)

## 2021-03-16 NOTE — Progress Notes (Signed)
[place] [phone]   [phone]  PROGRESS NOTE  Patient Care Team: Charlott Rakes, MD as PCP - General (Family Medicine)  Hematological/Oncological History 1) 08/05/2020: Labs from Dr. 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   2) 09/04/2020: Establish care with Dede Query PA-C  CHIEF COMPLAINTS/PURPOSE OF CONSULTATION:  "MGUS"  HISTORY OF PRESENTING ILLNESS:  Joseph Adkins 55 y.o. male returns for follow-up for MGUS.  Patient is unaccompanied for this visit.  Ports that his energy levels are fairly stable.  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  He denies any dietary restrictions or appetite changes.  Patient denies nausea, vomiting or abdominal pain.  His bowel habits are unchanged.  He denies easy bruising or signs of bleeding.  Patient continues to have intermittent episodes of chronic lightheadedness for over 2 years.  Patient has been evaluated by neurology.  He denies any recent syncopal episodes.  Patient denies any fevers, chills, night sweats, shortness of breath, chest pain or cough.  He has no other complaints.Rest of the 10 point ROS is below.    MEDICAL HISTORY:  Past Medical History:  Diagnosis Date   Hypertension     SURGICAL HISTORY: Past Surgical History:  Procedure Laterality Date   none      SOCIAL HISTORY: Social History   Socioeconomic History   Marital status: Single    Spouse name: Not on file   Number of children: Not on file   Years of education: Not on file   Highest education level: Not on file  Occupational History   Occupation: Administrator, arts    Employer: [employer]  Tobacco Use   Smoking status: Never   Smokeless tobacco: Never  Substance and Sexual Activity   Alcohol use: Yes    Alcohol/week: 0.0 standard drinks    Comment: 16-20 beers  per week   Drug use: No   Sexual activity: Not Currently  Other Topics Concern   Not on file  Social History Narrative   Right handed   Social Determinants of Health   Financial Resource Strain: Not on file  Food Insecurity: Not on file  Transportation Needs: Not on file  Physical Activity: Not on file  Stress: Not on file  Social Connections: Not on file  Intimate Partner Violence: Not on file    FAMILY HISTORY: Family History  Problem Relation Age of Onset   Diabetes Mother    Hypertension Mother    Diabetes Maternal Grandmother    Diabetes Maternal Grandfather    Hypertension Maternal Grandfather    Hyperlipidemia Maternal Grandfather    Diabetes Paternal Grandmother    Diabetes Paternal Grandfather    Hypertension Paternal Grandfather     ALLERGIES:  has No Known Allergies.  MEDICATIONS:  Current Outpatient Medications  Medication Sig Dispense Refill   amLODipine (NORVASC) 5 MG tablet TAKE 1 TABLET(5 MG) BY MOUTH DAILY 30 tablet 6   Flaxseed, Linseed, (FLAX SEED OIL) 1000 MG CAPS Take 1,000 mg by mouth daily.     gabapentin (NEURONTIN) 300 MG capsule Take 1 capsule (300 mg total) by mouth at bedtime. 30 capsule 3   losartan (COZAAR) 100 MG tablet TAKE 1 TABLET(100 MG) BY MOUTH DAILY 60 tablet 6   No current facility-administered medications for this visit.    REVIEW OF SYSTEMS:   Constitutional: ( - ) fevers, ( - )  chills , ( - )  night sweats Eyes: ( - ) blurriness of vision, ( - ) double vision, ( - ) watery eyes Ears, nose, mouth, throat, and face: ( - ) mucositis, ( - ) sore throat Respiratory: ( - ) cough, ( - ) dyspnea, ( - ) wheezes Cardiovascular: ( - ) palpitation, ( - ) chest discomfort, ( - ) lower extremity swelling Gastrointestinal:  ( - ) nausea, ( - ) heartburn, ( - ) change in bowel habits Skin: ( - ) abnormal skin rashes Lymphatics: ( - ) new lymphadenopathy, ( - ) easy bruising Neurological: ( + ) numbness, ( - ) tingling, ( - ) new  weaknesses Behavioral/Psych: ( - ) mood change, ( - ) new changes  All other systems were reviewed with the patient and are negative.  PHYSICAL EXAMINATION: ECOG PERFORMANCE STATUS: 0 - Asymptomatic  Vitals:   03/16/21 0816  BP: (!) 140/95  Pulse: 87  Resp: 19  Temp: 98.3 F (36.8 C)  SpO2: 100%   Filed Weights   03/16/21 0816  Weight: 168 lb 3.2 oz (76.3 kg)    GENERAL: well appearing male in NAD  SKIN: skin color, texture, turgor are normal, no rashes or significant lesions EYES: conjunctiva are pink and non-injected, sclera clear OROPHARYNX: no exudate, no erythema; lips, buccal mucosa, and tongue normal  NECK: supple, non-tender LUNGS: clear to auscultation and percussion with normal breathing effort HEART: regular rate & rhythm and no murmurs and no lower extremity edema ABDOMEN: soft, non-tender, non-distended, normal bowel sounds Musculoskeletal: no cyanosis of digits and no clubbing  PSYCH: alert & oriented x 3, fluent speech NEURO: no focal motor/sensory deficits  LABORATORY DATA:  I have reviewed the data as listed CBC Latest Ref Rng & Units 03/16/2021 09/04/2020 11/18/2019  WBC 4.0 - 10.5 K/uL 4.6 3.0(L) 4.9  Hemoglobin 13.0 - 17.0 g/dL 13.9 13.8 13.1  Hematocrit 39.0 - 52.0 % 39.0 39.3 38.7(L)  Platelets 150 - 400 K/uL 306 322 276    CMP Latest Ref Rng & Units 09/04/2020 08/05/2020 03/20/2020  Glucose 70 - 99 mg/dL 97 - -  BUN 6 - 20 mg/dL 12 - -  Creatinine 0.61 - 1.24 mg/dL 1.16 - -  Sodium 135 - 145 mmol/L 142 - -  Potassium 3.5 - 5.1 mmol/L 4.4 - -  Chloride 98 - 111 mmol/L 107 - -  CO2 22 - 32 mmol/L 22 - -  Calcium 8.9 - 10.3 mg/dL 8.7(L) - -  Total Protein 6.5 - 8.1 g/dL 8.0 7.6 7.8  Total Bilirubin 0.3 - 1.2 mg/dL 0.4 - 0.5  Alkaline Phos 38 - 126 U/L 53 - 64  AST 15 - 41 U/L 17 - 16  ALT 0 - 44 U/L 16 - 18    ASSESSMENT & PLAN Joseph Adkins is a 55 y.o. male returns for a follow up for MGUS.   #MGUS: --DG bone met surgery from 10/05/2020  was negative lytic lesions. Plan to repeat in one year May 2023.  --UPEP from 09/11/2020 was unremarkable. Plan to repeat in one year April 2023.  --Labs today shows unremarkable CBC, CMP, LDH. Serum free light chain shows mild elevation of kappa free light chain but ratio is normal range. SPEP and IFE is pending.  --RTC in 6 months with labs unless further intervention is required.   No orders of the defined types were placed in this encounter.   All questions were answered. The patient knows to call the clinic with any problems, questions  or concerns.  I have spent a total of 25 minutes minutes of face-to-face and non-face-to-face time, preparing to see the patient, performing a medically appropriate examination, counseling and educating the patient, ordering medications/tests/procedures, referring and communicating with other health care professionals, documenting clinical information in the electronic health record, independently interpreting results and communicating results to the patient, and care coordination.    Dede Query, PA-C Department of Hematology/Oncology [place] at [facility] Phone: [phone]

## 2021-03-17 LAB — KAPPA/LAMBDA LIGHT CHAINS
Kappa free light chain: 21.5 mg/L — ABNORMAL HIGH (ref 3.3–19.4)
Kappa, lambda light chain ratio: 1.2 (ref 0.26–1.65)
Lambda free light chains: 17.9 mg/L (ref 5.7–26.3)

## 2021-03-17 LAB — BETA 2 MICROGLOBULIN, SERUM: Beta-2 Microglobulin: 1.4 mg/L (ref 0.6–2.4)

## 2021-03-19 LAB — MULTIPLE MYELOMA PANEL, SERUM
Albumin SerPl Elph-Mcnc: 3.9 g/dL (ref 2.9–4.4)
Albumin/Glob SerPl: 1.2 (ref 0.7–1.7)
Alpha 1: 0.2 g/dL (ref 0.0–0.4)
Alpha2 Glob SerPl Elph-Mcnc: 0.5 g/dL (ref 0.4–1.0)
B-Globulin SerPl Elph-Mcnc: 1.3 g/dL (ref 0.7–1.3)
Gamma Glob SerPl Elph-Mcnc: 1.5 g/dL (ref 0.4–1.8)
Globulin, Total: 3.5 g/dL (ref 2.2–3.9)
IgA: 348 mg/dL (ref 90–386)
IgG (Immunoglobin G), Serum: 1474 mg/dL (ref 603–1613)
IgM (Immunoglobulin M), Srm: 96 mg/dL (ref 20–172)
Total Protein ELP: 7.4 g/dL (ref 6.0–8.5)

## 2021-07-11 ENCOUNTER — Other Ambulatory Visit: Payer: Self-pay | Admitting: Family Medicine

## 2021-07-11 DIAGNOSIS — I10 Essential (primary) hypertension: Secondary | ICD-10-CM

## 2021-07-12 NOTE — Telephone Encounter (Signed)
Requested Prescriptions  Pending Prescriptions Disp Refills   amLODipine (NORVASC) 5 MG tablet [Pharmacy Med Name: AMLODIPINE BESYLATE 5MG  TABLETS] 30 tablet 0    Sig: TAKE 1 TABLET(5 MG) BY MOUTH DAILY     Cardiovascular: Calcium Channel Blockers 2 Failed - 07/11/2021  3:51 PM      Failed - Last BP in normal range    BP Readings from Last 1 Encounters:  03/16/21 (!) 140/95         Failed - Valid encounter within last 6 months    Recent Outpatient Visits          8 months ago MGUS (monoclonal gammopathy of unknown significance)   [place], [place]   1 year ago [place], Charlane Ferretti, MD   1 year ago Pre-syncope   [place], MD   4 years ago Essential hypertension   Primary Care at [place], [place]   6 years ago Essential hypertension   Primary Care at [facility], Fenton Malling, MD             Passed - Last Heart Rate in normal range    Pulse Readings from Last 1 Encounters:  03/16/21 87

## 2021-07-12 NOTE — Telephone Encounter (Signed)
Duplicate request.  Requested Prescriptions  Pending Prescriptions Disp Refills   amLODipine (NORVASC) 5 MG tablet [Pharmacy Med Name: AMLODIPINE BESYLATE 5MG  TABLETS] 30 tablet 6    Sig: TAKE 1 TABLET(5 MG) BY MOUTH DAILY     Cardiovascular: Calcium Channel Blockers 2 Failed - 07/11/2021  3:51 PM      Failed - Last BP in normal range    BP Readings from Last 1 Encounters:  03/16/21 (!) 140/95         Failed - Valid encounter within last 6 months    Recent Outpatient Visits          8 months ago MGUS (monoclonal gammopathy of unknown significance)   [place], MD   1 year ago [place], Charlane Ferretti, MD   1 year ago Pre-syncope   [place], MD   4 years ago Essential hypertension   Primary Care at [place], [place]   6 years ago Essential hypertension   Primary Care at [facility], Fenton Malling, MD             Passed - Last Heart Rate in normal range    Pulse Readings from Last 1 Encounters:  03/16/21 87

## 2021-07-12 NOTE — Telephone Encounter (Signed)
Duplicate request.  Requested Prescriptions  Pending Prescriptions Disp Refills   amLODipine (NORVASC) 5 MG tablet [Pharmacy Med Name: AMLODIPINE BESYLATE 5MG  TABLETS] 30 tablet 6    Sig: TAKE 1 TABLET(5 MG) BY MOUTH DAILY     Cardiovascular: Calcium Channel Blockers 2 Failed - 07/11/2021  3:51 PM      Failed - Last BP in normal range    BP Readings from Last 1 Encounters:  03/16/21 (!) 140/95          Failed - Valid encounter within last 6 months    Recent Outpatient Visits           8 months ago MGUS (monoclonal gammopathy of unknown significance)   [place], MD   1 year ago [place], Charlane Ferretti, MD   1 year ago Pre-syncope   [place], MD   4 years ago Essential hypertension   Primary Care at [place], [place]   6 years ago Essential hypertension   Primary Care at [facility], Fenton Malling, MD              Passed - Last Heart Rate in normal range    Pulse Readings from Last 1 Encounters:  03/16/21 87

## 2021-07-16 ENCOUNTER — Other Ambulatory Visit: Payer: Self-pay

## 2021-07-16 ENCOUNTER — Emergency Department (HOSPITAL_COMMUNITY): Payer: No Typology Code available for payment source

## 2021-07-16 ENCOUNTER — Emergency Department (HOSPITAL_COMMUNITY)
Admission: EM | Admit: 2021-07-16 | Discharge: 2021-07-16 | Disposition: A | Discharge status: Home / Self Care | Payer: No Typology Code available for payment source | Attending: Emergency Medicine | Admitting: Emergency Medicine

## 2021-07-16 DIAGNOSIS — R0789 Other chest pain: Secondary | ICD-10-CM | POA: Insufficient documentation

## 2021-07-16 DIAGNOSIS — Z79899 Other long term (current) drug therapy: Secondary | ICD-10-CM | POA: Insufficient documentation

## 2021-07-16 DIAGNOSIS — I10 Essential (primary) hypertension: Secondary | ICD-10-CM | POA: Insufficient documentation

## 2021-07-16 LAB — BASIC METABOLIC PANEL
Anion gap: 8 (ref 5–15)
BUN: 14 mg/dL (ref 6–20)
CO2: 24 mmol/L (ref 22–32)
Calcium: 9.4 mg/dL (ref 8.9–10.3)
Chloride: 107 mmol/L (ref 98–111)
Creatinine, Ser: 1.22 mg/dL (ref 0.61–1.24)
GFR, Estimated: >60 mL/min (ref >60)
Glucose, Bld: 107 mg/dL — ABNORMAL HIGH (ref 70–99)
Potassium: 4.6 mmol/L (ref 3.5–5.1)
Sodium: 139 mmol/L (ref 135–145)

## 2021-07-16 LAB — CBC
HCT: 41.5 % (ref 39.0–52.0)
Hemoglobin: 13.9 g/dL (ref 13.0–17.0)
MCH: 31.7 pg (ref 26.0–34.0)
MCHC: 33.5 g/dL (ref 30.0–36.0)
MCV: 94.5 fL (ref 80.0–100.0)
Platelets: 347 10*3/uL (ref 150–400)
RBC: 4.39 MIL/uL (ref 4.22–5.81)
RDW: 13.2 % (ref 11.5–15.5)
WBC: 5.5 10*3/uL (ref 4.0–10.5)
nRBC: 0 % (ref 0.0–0.2)

## 2021-07-16 LAB — TROPONIN I (HIGH SENSITIVITY)
Troponin I (High Sensitivity): 7 ng/L (ref <18)
Troponin I (High Sensitivity): 7 ng/L (ref <18)

## 2021-07-16 NOTE — ED Notes (Addendum)
Per pt, he has taken his BP meds today. Patient reporting no symptoms at this time.

## 2021-07-16 NOTE — ED Triage Notes (Signed)
Pt here POV from work d/t intermittent chest pain X1 week. Pt at work today and experienced 1 sharp pain in chest non radiating. Also had blurred vision. Both have resolved. No neuro deficits noted. A/O X4

## 2021-07-16 NOTE — Discharge Instructions (Signed)
You are found to have a negative cardiac work-up in the ED.  Your labs were overall reassuring.  Your EKG was unchanged from prior.  Your symptoms were resolved prior to discharge.  Please schedule an appointment with your PCP.

## 2021-07-16 NOTE — ED Provider Notes (Signed)
[facility] Provider Note   CSN: [id number] Arrival date & time: 07/16/21  1113     History  Chief Complaint  Patient presents with   Chest Pain    Joseph Adkins is a 56 y.o. male.  Past medical history of hypertension.  Patient presents the emergency department for evaluation of chest pain.  He states that his chest pain started earlier this week.  The pain is present in his left side of his chest.  It does not radiate.  It feels sharp.  It is intermittent.  It is not associated with exertion.  He states that since Monday he has been having these pains intermittently.  He ended up presenting today because he bent over and had sudden onset of the pain and had associated blurry vision.  The pain and the vision changes lasted about 2 to 3 minutes and improved on its own.  Patient states that he has been evaluated in the past for chest pains very similar to the this presentation and has had negative work-ups each time.  He has seen a cardiologist and they did not find any evidence of heart disease.  He follows with his PCP regularly.   Chest Pain Associated symptoms: no fever, no palpitations and no shortness of breath    HPI: A 56 year old patient with a history of hypertension presents for evaluation of chest pain. Initial onset of pain was approximately 3-6 hours ago. The patient's chest pain is well-localized, is sharp and is not worse with exertion. The patient's chest pain is middle- or left-sided, is not described as heaviness/pressure/tightness and does not radiate to the arms/jaw/neck. The patient does not complain of nausea and denies diaphoresis. The patient has no history of stroke, has no history of peripheral artery disease, has not smoked in the past 90 days, denies any history of treated diabetes, has no relevant family history of coronary artery disease (first degree relative at less than age 64), has no history of hypercholesterolemia and  does not have an elevated BMI (>=30).   Home Medications Prior to Admission medications   Medication Sig Start Date End Date Taking? Authorizing Provider  amLODipine (NORVASC) 5 MG tablet TAKE 1 TABLET(5 MG) BY MOUTH DAILY 07/12/21   [place], Charlane Ferretti, MD  Flaxseed, Linseed, (FLAX SEED OIL) 1000 MG CAPS Take 1,000 mg by mouth daily.    [provider]  gabapentin (NEURONTIN) 300 MG capsule Take 1 capsule (300 mg total) by mouth at bedtime. 11/02/20   Charlott Rakes, MD  losartan (COZAAR) 100 MG tablet TAKE 1 TABLET(100 MG) BY MOUTH DAILY 11/02/20   Charlott Rakes, MD      Allergies    Patient has no known allergies.    Review of Systems   Review of Systems  Constitutional:  Negative for fever.  Eyes:  Positive for visual disturbance.  Respiratory:  Negative for shortness of breath.   Cardiovascular:  Positive for chest pain. Negative for palpitations and leg swelling.  All other systems reviewed and are negative.  Physical Exam Updated Vital Signs BP (!) 148/97 (BP Location: Right Arm)    Pulse 84    Temp 98.3 F (36.8 C) (Oral)    Resp 12    Ht 5\' 6"  (1.676 m)    Wt 74.8 kg    SpO2 100%    BMI 26.63 kg/m  Physical Exam Vitals and nursing note reviewed.  Constitutional:      General: He is not in acute  distress.    Appearance: Normal appearance. He is well-developed. He is not ill-appearing, toxic-appearing or diaphoretic.  HENT:     Head: Normocephalic and atraumatic.     Nose: No nasal deformity.     Mouth/Throat:     Lips: Pink. No lesions.     Mouth: Mucous membranes are moist. No injury, lacerations, oral lesions or angioedema.     Pharynx: Oropharynx is clear. Uvula midline. No pharyngeal swelling, oropharyngeal exudate, posterior oropharyngeal erythema or uvula swelling.  Eyes:     General: Gaze aligned appropriately. No scleral icterus.       Right eye: No discharge.        Left eye: No discharge.     Conjunctiva/sclera: Conjunctivae normal.     Right eye:  Right conjunctiva is not injected. No exudate or hemorrhage.    Left eye: Left conjunctiva is not injected. No exudate or hemorrhage. Cardiovascular:     Rate and Rhythm: Normal rate and regular rhythm.     Pulses: Normal pulses.          Radial pulses are 2+ on the right side and 2+ on the left side.       Dorsalis pedis pulses are 2+ on the right side and 2+ on the left side.     Heart sounds: Normal heart sounds, S1 normal and S2 normal. Heart sounds not distant. No murmur heard.   No friction rub. No gallop. No S3 or S4 sounds.  Pulmonary:     Effort: Pulmonary effort is normal. No accessory muscle usage or respiratory distress.     Breath sounds: Normal breath sounds. No stridor. No wheezing, rhonchi or rales.  Chest:     Chest wall: No tenderness.  Abdominal:     General: Abdomen is flat. Bowel sounds are normal. There is no distension.     Palpations: Abdomen is soft. There is no mass or pulsatile mass.     Tenderness: There is no abdominal tenderness. There is no guarding or rebound.  Musculoskeletal:     Right lower leg: No edema.     Left lower leg: No edema.  Skin:    General: Skin is warm and dry.     Coloration: Skin is not jaundiced or pale.     Findings: No bruising, erythema, lesion or rash.  Neurological:     General: No focal deficit present.     Mental Status: He is alert and oriented to person, place, and time.     GCS: GCS eye subscore is 4. GCS verbal subscore is 5. GCS motor subscore is 6.  Psychiatric:        Mood and Affect: Mood normal.        Behavior: Behavior normal. Behavior is cooperative.    ED Results / Procedures / Treatments   Labs (all labs ordered are listed, but only abnormal results are displayed) Labs Reviewed  BASIC METABOLIC PANEL - Abnormal; Notable for the following components:      Result Value   Glucose, Bld 107 (*)    All other components within normal limits  CBC  TROPONIN I (HIGH SENSITIVITY)  TROPONIN I (HIGH SENSITIVITY)     EKG EKG Interpretation  Date/Time:  Friday July 16 2021 11:20:33 EST Ventricular Rate:  110 PR Interval:  142 QRS Duration: 80 QT Interval:  312 QTC Calculation: 422 R Axis:   -14 Text Interpretation: Sinus tachycardia Left ventricular hypertrophy with repolarization abnormality ( R in aVL ) Abnormal ECG rate  is faster when compared to 2021 T wave changes similar to June 2021 Confirmed by Sherwood Gambler [phone]) on 07/16/2021 5:15:03 PM  Radiology DG Chest 2 View  Result Date: 07/16/2021 CLINICAL DATA:  Chest pain. EXAM: CHEST - 2 VIEW COMPARISON:  Oct 24, 2019. FINDINGS: No consolidation. No visible pleural effusions or pneumothorax. Similar cardiomediastinal silhouette, which is within normal limits. No change from the prior. IMPRESSION: No evidence of acute cardiopulmonary disease. Electronically Signed   By: Margaretha Sheffield M.D.   On: 07/16/2021 12:34    Procedures Procedures   Medications Ordered in ED Medications - No data to display  ED Course/ Medical Decision Making/ A&P   HEAR Score: 3                       Medical Decision Making Amount and/or Complexity of Data Reviewed Labs: ordered. Decision-making details documented in ED Course. Radiology: ordered and independent interpretation performed. Decision-making details documented in ED Course. ECG/medicine tests: ordered and independent interpretation performed. Decision-making details documented in ED Course.   This is a 56 y.o. male who presents to the ED with intermittent chest pain over the past week.  Chest pain is atypical and has no associated triggers.  On evaluation, he has normal vitals with a blood pressure of 135/99.  He is hemodynamically stable.  He is afebrile.  Exam is reassuring with normal heart sounds with and no murmurs.  There is no leg swelling or abnormal breath sounds.  Patient appears to be in no acute distress.  Pain is not reproducible or present at the time of my interview.  Pain is  atypical for ACS.  History does not suggest aortic dissection or Boerhaave syndrome.  Plan to obtain chest x-ray to evaluate for pneumothorax.  Patient does not have any respiratory symptoms concerning for pneumonia or bronchitis.  He has a low Wells score of 0.  He has no clinical signs or symptoms of DVT, has had multiple previous presentations similar to this so I do not have PE is minor 1 diagnosis, his heart rate was initially tachycardic, however this went down on its own and has maintained in the 80s to 90s range.  He has not had previous surgery or recent immobilization.  He has never had a history of pulmonary embolism or DVT.  Denies hemoptysis.  He does not have malignancy with treatment within 6 months.  I personally reviewed and interpreted all laboratory work and agree with radiologist interpretation of imaging. Abnormal results outlined below. CBC is without abnormality.  BMP with no electrolyte abnormalities, normal kidney function, no other metabolic concerning findings.  Troponin is 7.  Second troponin is in process, however this is not really necessary given that pain has been going on for 1 week now.  Chest x-ray does not show any evidence of pneumothorax, pneumonia, or other lung changes.  Initial EKG shows sinus tachycardia with no changes in EKG.  Tachycardia resolved without intervention while in the emergency department.  Impression: This is unlikely to be ACS.  Heart score is 3.  Patient's symptoms are atypical and are not associated with exertion or having any other concerning findings.  He has had atypical presentation very similar to this 1 in the past and has had normal work-up each time.  Labs are overall very reassuring.  I do not think this is a pulmonary embolism, aortic dissection, or Boerhaave syndrome.  She has remained chest pain-free without symptoms for several hours while  in the ED.  I feel comfortable with patient following up with his primary care provider and  returning to the emergency department if his symptoms worsen.  Portions of this note were generated with [employer]. Dictation errors may occur despite best attempts at proofreading.    Co morbidities that complicate the patient evaluation: Hypertension   Additional History:  External records from outside source obtained and reviewed including ED visits for similar atypical pain.  Seen by cardiology in 2021 for evaluation of the same symptoms.  He had a negative nuclear study.  No further work-up was initiated because it was thought that his symptoms were not suggestive of ischemia.   Cardiac Monitoring: The patient was maintained on a cardiac monitor.  I personally viewed and interpreted the cardiac monitored which showed an underlying rhythm of: Sinus bradycardia initially, however reverted to normal sinus rhythm without intervention as has been maintained that.    Medications:   I did Not order any medications because the patient was asymptomatic during the time of my interview. I have reviewed the patients home medicines and have made adjustments as needed   Social Determinants of Health: None   Disposition:  After consideration of the diagnostic results and the patients response to treatment, I feel that the patent would benefit from follow-up with PCP.  Final Clinical Impression(s) / ED Diagnoses Final diagnoses:  Atypical chest pain    Rx / DC Orders ED Discharge Orders     None         Adolphus Birchwood, PA-C 07/16/21 1939    Sherwood Gambler, MD 07/16/21 2229

## 2021-07-16 NOTE — ED Provider Triage Note (Signed)
Emergency Medicine Provider Triage Evaluation Note  Joseph Adkins , a 56 y.o. male  was evaluated in triage.  Pt complains of chest pain.  States his symptoms been intermittent for the past week without any discernible triggers.  He does state that he has been occurring occasionally for the past 4 to 5 years as well but not recently until the last week.  States that earlier today while he was at work he lifted a heavy box and felt a sharp pain in the left side of his chest and associated blurry vision that lasted for a few minutes and then subsided.  He currently states that his pain is 3 out of 10.  He denies any associated SOB or fevers or chills  Review of Systems  Positive: Chest pain Negative: Shortness of breath  Physical Exam  BP (!) 148/99 (BP Location: Right Arm)    Pulse (!) 109    Temp 98.3 F (36.8 C) (Oral)    Resp 16    Ht 5\' 6"  (1.676 m)    Wt 74.8 kg    SpO2 99%    BMI 26.63 kg/m  Gen:   Awake, no distress   Resp:  Normal effort  MSK:   Moves extremities without difficulty  Other:    Medical Decision Making  Medically screening exam initiated at 12:34 PM.  Appropriate orders placed.  Joseph Adkins was informed that the remainder of the evaluation will be completed by another provider, this initial triage assessment does not replace that evaluation, and the importance of remaining in the ED until their evaluation is complete.     Nestor Lewandowsky 07/16/21 1237

## 2021-08-19 ENCOUNTER — Other Ambulatory Visit: Payer: Self-pay | Admitting: Family Medicine

## 2021-08-19 DIAGNOSIS — I10 Essential (primary) hypertension: Secondary | ICD-10-CM

## 2021-09-14 ENCOUNTER — Other Ambulatory Visit: Payer: Self-pay

## 2021-09-14 ENCOUNTER — Inpatient Hospital Stay: Payer: No Typology Code available for payment source | Attending: Physician Assistant | Admitting: Physician Assistant

## 2021-09-14 ENCOUNTER — Inpatient Hospital Stay: Payer: Self-pay

## 2021-09-14 VITALS — BP 149/98 | HR 87 | Temp 97.3°F | Resp 16 | Wt 169.0 lb

## 2021-09-14 DIAGNOSIS — G629 Polyneuropathy, unspecified: Secondary | ICD-10-CM | POA: Insufficient documentation

## 2021-09-14 DIAGNOSIS — D472 Monoclonal gammopathy: Secondary | ICD-10-CM

## 2021-09-14 DIAGNOSIS — Z833 Family history of diabetes mellitus: Secondary | ICD-10-CM | POA: Insufficient documentation

## 2021-09-14 DIAGNOSIS — Z8249 Family history of ischemic heart disease and other diseases of the circulatory system: Secondary | ICD-10-CM | POA: Insufficient documentation

## 2021-09-14 DIAGNOSIS — Z8349 Family history of other endocrine, nutritional and metabolic diseases: Secondary | ICD-10-CM | POA: Insufficient documentation

## 2021-09-14 DIAGNOSIS — Z79899 Other long term (current) drug therapy: Secondary | ICD-10-CM | POA: Insufficient documentation

## 2021-09-14 LAB — CMP (CANCER CENTER ONLY)
ALT: 14 U/L (ref 0–44)
AST: 16 U/L (ref 15–41)
Albumin: 4.1 g/dL (ref 3.5–5.0)
Alkaline Phosphatase: 49 U/L (ref 38–126)
Anion gap: 8 (ref 5–15)
BUN: 16 mg/dL (ref 6–20)
CO2: 22 mmol/L (ref 22–32)
Calcium: 9.3 mg/dL (ref 8.9–10.3)
Chloride: 110 mmol/L (ref 98–111)
Creatinine: 1.2 mg/dL (ref 0.61–1.24)
GFR, Estimated: >60 mL/min (ref >60)
Glucose, Bld: 116 mg/dL — ABNORMAL HIGH (ref 70–99)
Potassium: 4 mmol/L (ref 3.5–5.1)
Sodium: 140 mmol/L (ref 135–145)
Total Bilirubin: 0.5 mg/dL (ref 0.3–1.2)
Total Protein: 7.8 g/dL (ref 6.5–8.1)

## 2021-09-14 LAB — CBC WITH DIFFERENTIAL (CANCER CENTER ONLY)
Abs Immature Granulocytes: 0.01 10*3/uL (ref 0.00–0.07)
Basophils Absolute: 0 10*3/uL (ref 0.0–0.1)
Basophils Relative: 1 %
Eosinophils Absolute: 0.1 10*3/uL (ref 0.0–0.5)
Eosinophils Relative: 2 %
HCT: 40 % (ref 39.0–52.0)
Hemoglobin: 13.9 g/dL (ref 13.0–17.0)
Immature Granulocytes: 0 %
Lymphocytes Relative: 22 %
Lymphs Abs: 0.9 10*3/uL (ref 0.7–4.0)
MCH: 31.7 pg (ref 26.0–34.0)
MCHC: 34.8 g/dL (ref 30.0–36.0)
MCV: 91.1 fL (ref 80.0–100.0)
Monocytes Absolute: 0.4 10*3/uL (ref 0.1–1.0)
Monocytes Relative: 10 %
Neutro Abs: 2.7 10*3/uL (ref 1.7–7.7)
Neutrophils Relative %: 65 %
Platelet Count: 350 10*3/uL (ref 150–400)
RBC: 4.39 MIL/uL (ref 4.22–5.81)
RDW: 13.3 % (ref 11.5–15.5)
WBC Count: 4.2 10*3/uL (ref 4.0–10.5)
nRBC: 0 % (ref 0.0–0.2)

## 2021-09-14 NOTE — Progress Notes (Signed)
?Joseph Adkins ?[phone]   [phone] ? ?PROGRESS NOTE ? ?Patient Care Team: ?Joseph Rakes, MD as PCP - General (Family Medicine) ? ?Hematological/Oncological History ?1) 08/05/2020: Labs from Joseph Adkins, neurologist, for workup for peripheral neuropathy and near syncope episodes: ?-Immunofixation showed IgA monoclonal protein with kappa light chain specificity.  ? ?2) 09/04/2020: Establish care with Joseph Adkins Joseph Adkins ? ?CHIEF COMPLAINTS/PURPOSE OF CONSULTATION:  ?"IgA Kappa MGUS" ? ?HISTORY OF PRESENTING ILLNESS:  ?Joseph Adkins 56 y.o. 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  Patient is unaccompanied for this visit.  ? ?At today's visit, Joseph Adkins reports that his energy levels are unchanged.  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  He denies any weight loss or recent appetite changes.  He denies nausea, vomiting or abdominal pain.  His bowel habits are unchanged without any recurrent episodes of diarrhea or constipation.  He denies easy bruising or signs of active bleeding.  Patient denies any new bone pain or back pain.  He denies fevers, chills, night sweats, shortness of breath, chest pain, cough, neuropathy or peripheral edema.He has no other complaints.Rest of the 10 point ROS is below.  ? ? ?MEDICAL HISTORY:  ?Past Medical History:  ?Diagnosis Date  ? Hypertension   ? ? ?SURGICAL HISTORY: ?Past Surgical History:  ?Procedure Laterality Date  ? none    ? ? ?SOCIAL HISTORY: ?Social History  ? ?Socioeconomic History  ? Marital status: Single  ?  Spouse name: Not on file  ? Number of children: Not on file  ? Years of education: Not on file  ? Highest education level: Not on file  ?Occupational History  ? Occupation: Administrator, arts  ?  Employer: [employer]  ?Tobacco Use  ? Smoking status: Never  ? Smokeless tobacco: Never  ?Substance and Sexual Activity  ? Alcohol use: Yes  ?  Alcohol/week: 0.0 standard drinks  ?  Comment: 16-20 beers per  week  ? Drug use: No  ? Sexual activity: Not Currently  ?Other Topics Concern  ? Not on file  ?Social History Narrative  ? Right handed  ? ?Social Determinants of Health  ? ?Financial Resource Strain: Not on file  ?Food Insecurity: Not on file  ?Transportation Needs: Not on file  ?Physical Activity: Not on file  ?Stress: Not on file  ?Social Connections: Not on file  ?Intimate Partner Violence: Not on file  ? ? ?FAMILY HISTORY: ?Family History  ?Problem Relation Age of Onset  ? Diabetes Mother   ? Hypertension Mother   ? Diabetes Maternal Grandmother   ? Diabetes Maternal Grandfather   ? Hypertension Maternal Grandfather   ? Hyperlipidemia Maternal Grandfather   ? Diabetes Paternal Grandmother   ? Diabetes Paternal Grandfather   ? Hypertension Paternal Grandfather   ? ? ?ALLERGIES:  has No Known Allergies. ? ?MEDICATIONS:  ?Current Outpatient Medications  ?Medication Sig Dispense Refill  ? amLODipine (NORVASC) 5 MG tablet TAKE 1 TABLET(5 MG) BY MOUTH DAILY 30 tablet 0  ? Flaxseed, Linseed, (FLAX SEED OIL) 1000 MG CAPS Take 1,000 mg by mouth daily.    ? gabapentin (NEURONTIN) 300 MG capsule Take 1 capsule (300 mg total) by mouth at bedtime. 30 capsule 3  ? losartan (COZAAR) 100 MG tablet TAKE 1 TABLET(100 MG) BY MOUTH DAILY 60 tablet 6  ? ?No current facility-administered medications for this visit.  ? ? ?REVIEW OF SYSTEMS:   ?Constitutional: ( - ) fevers, ( - )  chills , ( - ) night sweats ?Eyes: ( - ) blurriness of vision, ( - ) double vision, ( - ) watery eyes ?Ears, nose, mouth, throat, and face: ( - ) mucositis, ( - ) sore throat ?Respiratory: ( - ) cough, ( - ) dyspnea, ( - ) wheezes ?Cardiovascular: ( - ) palpitation, ( - ) chest discomfort, ( - ) lower extremity swelling ?Gastrointestinal:  ( - ) nausea, ( - ) heartburn, ( - ) change in bowel habits ?Skin: ( - ) abnormal skin rashes ?Lymphatics: ( - ) new lymphadenopathy, ( - ) easy bruising ?Neurological: (- ) numbness, ( - ) tingling, ( - ) new  weaknesses ?Behavioral/Psych: ( - ) mood change, ( - ) new changes  ?All other systems were reviewed with the patient and are negative. ? ?PHYSICAL EXAMINATION: ?ECOG PERFORMANCE STATUS: 0 - Asymptomatic ? ?Vitals:  ? 09/14/21 0828  ?BP: (!) 149/98  ?Pulse: 87  ?Resp: 16  ?Temp: (!) 97.3 ?F (36.3 ?C)  ?SpO2: 99%  ? ?Filed Weights  ? 09/14/21 0828  ?Weight: 169 lb (76.7 kg)  ? ? ?GENERAL: well appearing male in NAD  ?SKIN: skin color, texture, turgor are normal, no rashes or significant lesions ?EYES: conjunctiva are pink and non-injected, sclera clear ?LUNGS: clear to auscultation and percussion with normal breathing effort ?HEART: regular rate & rhythm and no murmurs and no lower extremity edema ?Musculoskeletal: no cyanosis of digits and no clubbing  ?PSYCH: alert & oriented x 3, fluent speech ?NEURO: no focal motor/sensory deficits ? ?LABORATORY DATA:  ?I have reviewed the data as listed ? ?  Latest Ref Rng & Units 09/14/2021  ?  8:17 AM 07/16/2021  ? 12:53 PM 03/16/2021  ?  8:07 AM  ?CBC  ?WBC 4.0 - 10.5 K/uL 4.2   5.5   4.6    ?Hemoglobin 13.0 - 17.0 g/dL 13.9   13.9   13.9    ?Hematocrit 39.0 - 52.0 % 40.0   41.5   39.0    ?Platelets 150 - 400 K/uL 350   347   306    ? ? ? ?  Latest Ref Rng & Units 07/16/2021  ? 12:53 PM 03/16/2021  ?  8:07 AM 09/04/2020  ?  9:58 AM  ?CMP  ?Glucose 70 - 99 mg/dL 107   106   97    ?BUN 6 - 20 mg/dL _0 ?Creatinine 0.61 - 1.24 mg/dL 1.22   1.16   1.16    ?Sodium 135 - 145 mmol/L 139   139   142    ?Potassium 3.5 - 5.1 mmol/L 4.6   3.7   4.4    ?Chloride 98 - 111 mmol/L 107   106   107    ?CO2 22 - 32 mmol/L _1 ?Calcium 8.9 - 10.3 mg/dL 9.4   9.4   8.7    ?Total Protein 6.5 - 8.1 g/dL  8.0   8.0    ?Total Bilirubin 0.3 - 1.2 mg/dL  0.8   0.4    ?Alkaline Phos 38 - 126 U/L  53   53    ?AST 15 - 41 U/L  18   17    ?ALT 0 - 44 U/L  17   16    ? ? ?ASSESSMENT & PLAN ?[name] is a 56 y.o. male returns for a follow up for  IgA Kappa MGUS.  ? ?#IgA Kappa  MGUS: ?--DG bone met surgery from 10/05/2020 was negative lytic lesions. Due for repeat bone survey at this time, plan to schedule in the next 1-2 weeks ?--UPEP from 09/11/2020 was unremarkable. Due for repeat UPEP at this time, awaiting for patient to return sample for analysis.  ?--Prior MM panel and sFLC from 02/2021 showed a faint band was noted in IgA and kappa light chain elevated at 21.5.  ?--Labs today shows unremarkable CBC, CMP, LDH. Pending MM panel and sFLC. ?--Consider BMB if M protein is measurable or kappa light chains start to increase.  ? ?#Hypertension: ?--Current BP regimen includes amlodipine 5 mg daily and losartan 100 mg daily ?--Today's BP was 149/98. Patient is asymptomatic without headaches, dizziness or vision changes. ?--Patient reports average BP reading at home is in 120's/80's.  ?--Advised to continue to monitor BP at home and follow up with PCP  ? ?Follow up: ?--RTC in 6 months with labs and visit with Dr. Lorenso Courier ? ?Orders Placed This Encounter  ?Procedures  ? DG Bone Survey Met  ?  Standing Status:   Future  ?  Standing Expiration Date:   09/15/2022  ?  Order Specific Question:   Reason for Exam (SYMPTOM  OR DIAGNOSIS REQUIRED)  ?  Answer:   evaluate for lytic lesions  ?  Order Specific Question:   Preferred imaging location?  ?  Answer:   [facility]  ? ? ? ?All questions were answered. The patient knows to call the clinic with any problems, questions or concerns. ? ?I have spent a total of 25 minutes minutes of face-to-face and non-face-to-face time, preparing to see the patient, performing a medically appropriate examination, counseling and educating the patient, ordering tests/procedures, referring and communicating with other health care professionals, documenting clinical information in the electronic health record, independently interpreting results and communicating results to the patient, and care coordination.  ? ? ?Joseph Adkins, Joseph Adkins ?Department of  Hematology/Oncology ?[place] [facility] ?Phone: [phone] ?

## 2021-09-15 LAB — KAPPA/LAMBDA LIGHT CHAINS
Kappa free light chain: 21 mg/L — ABNORMAL HIGH (ref 3.3–19.4)
Kappa, lambda light chain ratio: 1.26 (ref 0.26–1.65)
Lambda free light chains: 16.7 mg/L (ref 5.7–26.3)

## 2021-09-16 LAB — MULTIPLE MYELOMA PANEL, SERUM
Albumin SerPl Elph-Mcnc: 3.9 g/dL (ref 2.9–4.4)
Albumin/Glob SerPl: 1.2 (ref 0.7–1.7)
Alpha 1: 0.3 g/dL (ref 0.0–0.4)
Alpha2 Glob SerPl Elph-Mcnc: 0.5 g/dL (ref 0.4–1.0)
B-Globulin SerPl Elph-Mcnc: 1.2 g/dL (ref 0.7–1.3)
Gamma Glob SerPl Elph-Mcnc: 1.4 g/dL (ref 0.4–1.8)
Globulin, Total: 3.4 g/dL (ref 2.2–3.9)
IgA: 338 mg/dL (ref 90–386)
IgG (Immunoglobin G), Serum: 1479 mg/dL (ref 603–1613)
IgM (Immunoglobulin M), Srm: 87 mg/dL (ref 20–172)
Total Protein ELP: 7.3 g/dL (ref 6.0–8.5)

## 2021-09-17 ENCOUNTER — Ambulatory Visit (HOSPITAL_COMMUNITY): Admission: RE | Admit: 2021-09-17 | Payer: No Typology Code available for payment source | Source: Ambulatory Visit

## 2021-09-17 ENCOUNTER — Other Ambulatory Visit: Payer: Self-pay | Admitting: *Deleted

## 2021-09-17 DIAGNOSIS — D472 Monoclonal gammopathy: Secondary | ICD-10-CM

## 2021-09-21 LAB — UPEP/UIFE/LIGHT CHAINS/TP, 24-HR UR
% BETA, Urine: 0 %
ALPHA 1 URINE: 0 %
Albumin, U: 100 %
Alpha 2, Urine: 0 %
Free Kappa Lt Chains,Ur: 23.32 mg/L (ref 1.17–86.46)
Free Kappa/Lambda Ratio: 4.2 (ref 1.83–14.26)
Free Lambda Lt Chains,Ur: 5.55 mg/L (ref 0.27–15.21)
GAMMA GLOBULIN URINE: 0 %
Total Protein, Urine-Ur/day: <94 mg/24 hr (ref 30–150)
Total Protein, Urine: <4 mg/dL
Total Volume: 2350

## 2021-09-28 ENCOUNTER — Telehealth: Payer: Self-pay

## 2021-09-28 NOTE — Telephone Encounter (Signed)
-----   Message from Lincoln Brigham, PA-C sent at 09/27/2021  9:31 AM EDT ----- ?Please notify patient that labs sow stable protein levels. Continue to monitor. He needs to schedule the bone survey at this time. Can you help him get that done? ? ? ?

## 2021-09-28 NOTE — Telephone Encounter (Signed)
Pt advised of labs with VU.  ? ?Bone survey scheduled for Monday 10/04/21 at 9:00 at [facility].  Pt advised ?

## 2021-10-04 ENCOUNTER — Ambulatory Visit (HOSPITAL_COMMUNITY)
Admission: RE | Admit: 2021-10-04 | Discharge: 2021-10-04 | Disposition: A | Discharge status: Home / Self Care | Payer: No Typology Code available for payment source | Source: Ambulatory Visit | Attending: Physician Assistant | Admitting: Physician Assistant

## 2021-10-04 DIAGNOSIS — D472 Monoclonal gammopathy: Secondary | ICD-10-CM | POA: Insufficient documentation

## 2021-10-11 ENCOUNTER — Encounter (HOSPITAL_COMMUNITY): Payer: Self-pay

## 2021-10-11 ENCOUNTER — Other Ambulatory Visit: Payer: Self-pay

## 2021-10-11 ENCOUNTER — Emergency Department (HOSPITAL_COMMUNITY)
Admission: EM | Admit: 2021-10-11 | Discharge: 2021-10-12 | Disposition: A | Discharge status: Home / Self Care | Payer: No Typology Code available for payment source | Attending: Emergency Medicine | Admitting: Emergency Medicine

## 2021-10-11 ENCOUNTER — Emergency Department (HOSPITAL_COMMUNITY): Payer: No Typology Code available for payment source

## 2021-10-11 DIAGNOSIS — R0789 Other chest pain: Secondary | ICD-10-CM | POA: Insufficient documentation

## 2021-10-11 DIAGNOSIS — Z79899 Other long term (current) drug therapy: Secondary | ICD-10-CM | POA: Insufficient documentation

## 2021-10-11 DIAGNOSIS — I10 Essential (primary) hypertension: Secondary | ICD-10-CM | POA: Insufficient documentation

## 2021-10-11 DIAGNOSIS — R000 Tachycardia, unspecified: Secondary | ICD-10-CM | POA: Insufficient documentation

## 2021-10-11 DIAGNOSIS — M545 Low back pain, unspecified: Secondary | ICD-10-CM | POA: Insufficient documentation

## 2021-10-11 LAB — BASIC METABOLIC PANEL
Anion gap: 6 (ref 5–15)
BUN: 16 mg/dL (ref 6–20)
CO2: 23 mmol/L (ref 22–32)
Calcium: 9 mg/dL (ref 8.9–10.3)
Chloride: 109 mmol/L (ref 98–111)
Creatinine, Ser: 1.22 mg/dL (ref 0.61–1.24)
GFR, Estimated: >60 mL/min (ref >60)
Glucose, Bld: 113 mg/dL — ABNORMAL HIGH (ref 70–99)
Potassium: 3.6 mmol/L (ref 3.5–5.1)
Sodium: 138 mmol/L (ref 135–145)

## 2021-10-11 LAB — CBC
HCT: 38.5 % — ABNORMAL LOW (ref 39.0–52.0)
Hemoglobin: 13.5 g/dL (ref 13.0–17.0)
MCH: 33.2 pg (ref 26.0–34.0)
MCHC: 35.1 g/dL (ref 30.0–36.0)
MCV: 94.6 fL (ref 80.0–100.0)
Platelets: 314 10*3/uL (ref 150–400)
RBC: 4.07 MIL/uL — ABNORMAL LOW (ref 4.22–5.81)
RDW: 13.3 % (ref 11.5–15.5)
WBC: 5.3 10*3/uL (ref 4.0–10.5)
nRBC: 0 % (ref 0.0–0.2)

## 2021-10-11 LAB — TROPONIN I (HIGH SENSITIVITY): Troponin I (High Sensitivity): 5 ng/L (ref <18)

## 2021-10-11 NOTE — ED Provider Triage Note (Signed)
Emergency Medicine Provider Triage Evaluation Note ? ?Joseph Adkins , a 56 y.o. male  was evaluated in triage.  Pt complains of chest pain.  Has had intermittent chest pain for the past 2 weeks that has gradually worsened.  Also notices a throbbing sensation in his left lower extremity.  He is unable to recall any event that may have triggered his symptoms.  Reports tearing pain.  No cardiac history that he is aware of. ? ?Review of Systems  ?Positive: Chest pain ?Negative: Shortness of breath ? ?Physical Exam  ?BP (!) 174/107   Pulse (!) 104   Temp 98.6 ?F (37 ?C) (Oral)   Resp 18   Ht '5\' 6"'$  (1.676 m)   Wt 75.8 kg   SpO2 98%   BMI 26.95 kg/m?  ?Gen:   Awake, no distress   ?Resp:  Normal effort  ?MSK:   Moves extremities without difficulty  ?Other:  No lower extremity edema.  There is a palpable DP pulse on the left foot however difficult to find pulse on right foot ? ?Medical Decision Making  ?Medically screening exam initiated at 8:53 PM.  Appropriate orders placed.  Joseph Adkins was informed that the remainder of the evaluation will be completed by another provider, this initial triage assessment does not replace that evaluation, and the importance of remaining in the ED until their evaluation is complete. ? ?Informed charge nurse of patient's symptoms as well as difficulty finding pulse on right foot and will room asap, dissection study and labs ordered ?  ?Delia Heady, PA-C ?10/11/21 2055 ? ?

## 2021-10-11 NOTE — ED Triage Notes (Signed)
Patient arrives POV from work c/o left-sided chest pain that started approx 1 & 1/2 weeks ago. Pain is intermittent; pt reports pain "feels like its tearing." Pt also reports a "pulsing feeling" in left leg that he feels when laying in bed. Pt has hx of HTN. A7O x4, no acute distress. ?

## 2021-10-11 NOTE — ED Provider Notes (Signed)
?[facility] ?Provider Note ? ? ?CSN: [id number] ?Arrival date & time: 10/11/21  1946 ? ?  ? ?History ? ?Chief Complaint  ?Patient presents with  ? Chest Pain  ? ? ?Joseph Adkins is a 56 y.o. male. ? ?HPI ? ?  ? ?This is a 56 year old male with a history of hypertension who presents with chest pain.  Patient reports 7 to 10-day history of intermittent left-sided chest pain.  He states that the pain is tearing in nature.  He has developed some left scapular pain and left lower back pain as well.  He has not noted any weakness, numbness, tingling in his extremities but has noted throbbing in his left lower extremity.  He denies any triggers.  Denies any respiratory symptoms such as cough or shortness of breath.  Denies diabetes, smoking history, hyperlipidemia.  No early family history of heart disease. ? ?Home Medications ?Prior to Admission medications   ?Medication Sig Start Date End Date Taking? Authorizing Provider  ?acetaminophen (TYLENOL) 500 MG tablet Take 500 mg by mouth every 6 (six) hours as needed for moderate pain or headache.   Yes [provider]  ?amLODipine (NORVASC) 5 MG tablet TAKE 1 TABLET(5 MG) BY MOUTH DAILY ?Patient taking differently: Take 5 mg by mouth daily. 07/12/21  Yes Charlott Rakes, MD  ?ferrous sulfate 324 MG TBEC Take 324 mg by mouth daily with breakfast.   Yes [provider]  ?Flaxseed, Linseed, (FLAX SEED OIL) 1000 MG CAPS Take 1,000 mg by mouth daily.   Yes [provider]  ?gabapentin (NEURONTIN) 300 MG capsule Take 1 capsule (300 mg total) by mouth at bedtime. 11/02/20  Yes Charlott Rakes, MD  ?losartan (COZAAR) 100 MG tablet TAKE 1 TABLET(100 MG) BY MOUTH DAILY ?Patient taking differently: Take 100 mg by mouth daily. 11/02/20  Yes Charlott Rakes, MD  ?   ? ?Allergies    ?Patient has no known allergies.   ? ?Review of Systems   ?Review of Systems  ?Constitutional:  Negative for fever.  ?Respiratory:  Negative for  shortness of breath.   ?Cardiovascular:  Positive for chest pain. Negative for leg swelling.  ?Gastrointestinal:  Negative for abdominal pain.  ?All other systems reviewed and are negative. ? ?Physical Exam ?Updated Vital Signs ?BP (!) 156/139   Pulse 74   Temp 98.6 ?F (37 ?C) (Oral)   Resp 13   Ht 1.676 m ('5\' 6"'$ )   Wt 75.8 kg   SpO2 98%   BMI 26.95 kg/m?  ?Physical Exam ?Vitals and nursing note reviewed.  ?Constitutional:   ?   Appearance: He is well-developed. He is not ill-appearing.  ?HENT:  ?   Head: Normocephalic and atraumatic.  ?Eyes:  ?   Pupils: Pupils are equal, round, and reactive to light.  ?Cardiovascular:  ?   Rate and Rhythm: Normal rate and regular rhythm.  ?   Heart sounds: Normal heart sounds. No murmur heard. ?Pulmonary:  ?   Effort: Pulmonary effort is normal. No respiratory distress.  ?   Breath sounds: Normal breath sounds. No wheezing.  ?Abdominal:  ?   General: Bowel sounds are normal.  ?   Palpations: Abdomen is soft.  ?   Tenderness: There is no abdominal tenderness. There is no rebound.  ?Musculoskeletal:  ?   Cervical back: Neck supple.  ?   Right lower leg: No tenderness. No edema.  ?   Left lower leg: No tenderness. No edema.  ?Lymphadenopathy:  ?  Cervical: No cervical adenopathy.  ?Skin: ?   General: Skin is warm and dry.  ?Neurological:  ?   Mental Status: He is alert and oriented to person, place, and time.  ?   Comments: Cranial nerves II through XII intact, 5 out of 5 strength in all 4 extremity  ?Psychiatric:     ?   Mood and Affect: Mood normal.  ? ? ?ED Results / Procedures / Treatments   ?Labs ?(all labs ordered are listed, but only abnormal results are displayed) ?Labs Reviewed  ?BASIC METABOLIC PANEL - Abnormal; Notable for the following components:  ?    Result Value  ? Glucose, Bld 113 (*)   ? All other components within normal limits  ?CBC - Abnormal; Notable for the following components:  ? RBC 4.07 (*)   ? HCT 38.5 (*)   ? All other components within normal  limits  ?TROPONIN I (HIGH SENSITIVITY)  ?TROPONIN I (HIGH SENSITIVITY)  ? ? ?EKG ?EKG Interpretation ? ?Date/Time:  Monday Oct 11 2021 20:09:16 EDT ?Ventricular Rate:  102 ?PR Interval:  140 ?QRS Duration: 88 ?QT Interval:  348 ?QTC Calculation: 453 ?R Axis:   -38 ?Text Interpretation: Sinus tachycardia Left axis deviation ST & T wave abnormality, consider lateral ischemia Abnormal ECG When compared with ECG of 16-Jul-2021 11:20, PREVIOUS ECG IS PRESENT Confirmed by Thayer Jew [phone]) on 10/11/2021 11:26:46 PM ? ?Radiology ?DG Chest 2 View ? ?Result Date: 10/11/2021 ?CLINICAL DATA:  Chest pain and shortness of breath. EXAM: CHEST - 2 VIEW COMPARISON:  Chest x-ray 07/16/2021 FINDINGS: The heart size and mediastinal contours are within normal limits. Both lungs are clear. The visualized skeletal structures are unremarkable. IMPRESSION: No active cardiopulmonary disease. Electronically Signed   By: Ronney Asters M.D.   On: 10/11/2021 21:13  ? ?CT Angio Chest/Abd/Pel for Dissection W and/or W/WO ? ?Result Date: 10/12/2021 ?CLINICAL DATA:  Intermittent chest pain x2 weeks EXAM: CT ANGIOGRAPHY CHEST, ABDOMEN AND PELVIS TECHNIQUE: Non-contrast CT of the chest was initially obtained. Multidetector CT imaging through the chest, abdomen and pelvis was performed using the standard protocol during bolus administration of intravenous contrast. Multiplanar reconstructed images and MIPs were obtained and reviewed to evaluate the vascular anatomy. RADIATION DOSE REDUCTION: This exam was performed according to the departmental dose-optimization program which includes automated exposure control, adjustment of the mA and/or kV according to patient size and/or use of iterative reconstruction technique. CONTRAST:  138m OMNIPAQUE IOHEXOL 350 MG/ML SOLN COMPARISON:  Chest radiograph dated 10/11/2021 FINDINGS: CTA CHEST FINDINGS Cardiovascular: On unenhanced CT, there is no evidence of intramural hematoma. Following contrast  administration, there is no evidence of thoracic aortic aneurysm or dissection. Although not tailored for evaluation of the pulmonary arteries, there is no evidence of central pulmonary embolism. The heart is normal in size.  No pericardial effusion. Mediastinum/Nodes: No suspicious mediastinal lymphadenopathy. Visualized thyroid is unremarkable. Lungs/Pleura: Lungs are clear. No suspicious pulmonary nodules. No focal consolidation. No pleural effusion or pneumothorax. Musculoskeletal: Mild degenerative changes of the lower thoracic spine. Review of the MIP images confirms the above findings. CTA ABDOMEN AND PELVIS FINDINGS VASCULAR Aorta: No evidence abdominal aortic aneurysm or dissection.  Patent. Celiac: Patent. SMA: Patent. Renals: Patent bilaterally. IMA: Patent. Inflow: Patent. Mild atherosclerotic calcifications of the right common iliac artery. Veins: Grossly unremarkable. Review of the MIP images confirms the above findings. NON-VASCULAR Hepatobiliary: Liver is within normal limits. Gallbladder is unremarkable. No intrahepatic or extrahepatic ductal dilatation. Pancreas: Within normal limits. Spleen: Within normal  limits. Adrenals/Urinary Tract: Adrenal glands are within normal limits. Left renal cysts, measuring up to 3.4 cm in the posterior upper pole (series 5/image 99). Right kidney is within normal limits. No hydronephrosis. Thick-walled bladder, although underdistended. Stomach/Bowel: Stomach is within normal limits. No evidence of bowel obstruction. Normal appendix (series 5/image 142). No colonic wall thickening or inflammatory changes. Lymphatic: No suspicious abdominopelvic lymphadenopathy. Reproductive: Prostate is at the upper limits of normal for size. Other: No abdominopelvic ascites. Musculoskeletal: Mild degenerative changes at L5-S1. Suspected prior posttraumatic deformity at the left parasymphyseal region. Review of the MIP images confirms the above findings. IMPRESSION: No evidence of  thoracoabdominal aortic aneurysm or dissection. No evidence of central pulmonary embolism. Thick-walled bladder, although underdistended. Correlate for chronic bladder outlet obstruction. Electronically Signed   By: Bertis Ruddy

## 2021-10-12 ENCOUNTER — Emergency Department (HOSPITAL_COMMUNITY): Payer: No Typology Code available for payment source

## 2021-10-12 LAB — TROPONIN I (HIGH SENSITIVITY): Troponin I (High Sensitivity): 7 ng/L (ref <18)

## 2021-10-12 MED ORDER — IOHEXOL 350 MG/ML SOLN
100.0000 mL | Freq: Once | INTRAVENOUS | Status: AC | PRN
  Administered 2021-10-12: 100 mL via INTRAVENOUS

## 2021-10-12 NOTE — Discharge Instructions (Signed)
You were seen today for chest pain.  Your work-up is reassuring including CT scan and heart testing.  Given your age and risk factors, you should follow-up with cardiology for definitive stress testing. ?

## 2021-10-26 NOTE — Progress Notes (Deleted)
Office Visit    Patient Name: Joseph Adkins Date of Encounter: 10/26/2021  Primary Care Provider:  Charlott Rakes, MD Primary Cardiologist:  Kirk Ruths, MD  Chief Complaint    56 year old male with a history of atypical chest pain, carotid artery stenosis, near syncope, and hypertension who presents for follow-up related to chest pain.  Past Medical History    Past Medical History:  Diagnosis Date   Hypertension    Past Surgical History:  Procedure Laterality Date   none      Allergies  No Known Allergies  History of Present Illness    56 year old male with the above past medical history including atypical chest pain, carotid artery stenosis, near syncope, and hypertension.  He was hospitalized in August 2018 following a near syncopal episode associated with headaches.  Carotid Dopplers at the time showed 1 to 39% bilateral ICA stenosis.  Echocardiogram showed vigorous LV systolic function, G1 DD.  CT of the head was unremarkable.  Outpatient monitor in October 2018 showed sinus tachycardia.  Stress test in December 2018 showed no evidence of ischemia, EF 47%, but visually appeared better.  He was evaluated in the ED in December 2021 with atypical chest pain, dizzy spell.  Work-up was unrevealing.  He was last seen in the office on 07/02/2019 and was stable from a cardiac standpoint.  His BP was mildly elevated.  He denies symptoms concerning for angina.  He has not been seen in the office since.  He presented to the ED in February 2023 with sharp, intermittent non-exertional left-sided chest pain.  EKG showed sinus tachycardia, troponin was negative.  He returned to the ED on 10/11/2021 with a 7 to 10-day history of intermittent left-sided chest pain.  He described pain as a tearing, that radiated to his left scapular area and left lower back.  EKG was unremarkable, troponin was negative x2, CTA chest showed no evidence of dissection negative for PE.  He was discharged home  in stable condition and advised to follow-up with cardiology as an outpatient.  Since today for follow-up.  Since his most recent ED visit   Atypical chest pain: Carotid artery stenosis: Hypertension: H/o near syncope:  Disposition: Home Medications    Current Outpatient Medications  Medication Sig Dispense Refill   acetaminophen (TYLENOL) 500 MG tablet Take 500 mg by mouth every 6 (six) hours as needed for moderate pain or headache.     amLODipine (NORVASC) 5 MG tablet TAKE 1 TABLET(5 MG) BY MOUTH DAILY (Patient taking differently: Take 5 mg by mouth daily.) 30 tablet 0   ferrous sulfate 324 MG TBEC Take 324 mg by mouth daily with breakfast.     Flaxseed, Linseed, (FLAX SEED OIL) 1000 MG CAPS Take 1,000 mg by mouth daily.     gabapentin (NEURONTIN) 300 MG capsule Take 1 capsule (300 mg total) by mouth at bedtime. 30 capsule 3   losartan (COZAAR) 100 MG tablet TAKE 1 TABLET(100 MG) BY MOUTH DAILY (Patient taking differently: Take 100 mg by mouth daily.) 60 tablet 6   No current facility-administered medications for this visit.     Review of Systems    ***.  All other systems reviewed and are otherwise negative except as noted above.    Physical Exam    VS:  There were no vitals taken for this visit. , BMI There is no height or weight on file to calculate BMI.     GEN: Well nourished, well developed, in no acute  distress. HEENT: normal. Neck: Supple, no JVD, carotid bruits, or masses. Cardiac: RRR, no murmurs, rubs, or gallops. No clubbing, cyanosis, edema.  Radials/DP/PT 2+ and equal bilaterally.  Respiratory:  Respirations regular and unlabored, clear to auscultation bilaterally. GI: Soft, nontender, nondistended, BS + x 4. MS: no deformity or atrophy. Skin: warm and dry, no rash. Neuro:  Strength and sensation are intact. Psych: Normal affect.  Accessory Clinical Findings    ECG personally reviewed by me today - *** - no acute changes.  Lab Results  Component Value  Date   WBC 5.3 10/11/2021   HGB 13.5 10/11/2021   HCT 38.5 (L) 10/11/2021   MCV 94.6 10/11/2021   PLT 314 10/11/2021   Lab Results  Component Value Date   CREATININE 1.22 10/11/2021   BUN 16 10/11/2021   NA 138 10/11/2021   K 3.6 10/11/2021   CL 109 10/11/2021   CO2 23 10/11/2021   Lab Results  Component Value Date   ALT 14 09/14/2021   AST 16 09/14/2021   ALKPHOS 49 09/14/2021   BILITOT 0.5 09/14/2021   Lab Results  Component Value Date   CHOL 229 (H) 03/20/2020   HDL 87 03/20/2020   LDLCALC 135 (H) 03/20/2020   TRIG 40 03/20/2020   CHOLHDL 2.6 03/20/2020    Lab Results  Component Value Date   HGBA1C 5.2 03/20/2020    Assessment & Plan    1.  ***   Lenna Sciara, NP 10/26/2021, 2:35 PM

## 2021-10-27 ENCOUNTER — Ambulatory Visit: Payer: No Typology Code available for payment source | Admitting: Nurse Practitioner

## 2021-10-27 DIAGNOSIS — R0789 Other chest pain: Secondary | ICD-10-CM

## 2021-10-27 DIAGNOSIS — I6523 Occlusion and stenosis of bilateral carotid arteries: Secondary | ICD-10-CM

## 2021-10-27 DIAGNOSIS — I10 Essential (primary) hypertension: Secondary | ICD-10-CM

## 2021-10-27 DIAGNOSIS — R55 Syncope and collapse: Secondary | ICD-10-CM

## 2021-11-01 ENCOUNTER — Encounter: Payer: Self-pay | Admitting: Nurse Practitioner

## 2021-12-01 ENCOUNTER — Encounter: Payer: Self-pay | Admitting: *Deleted

## 2021-12-17 ENCOUNTER — Other Ambulatory Visit: Payer: Self-pay | Admitting: Family Medicine

## 2021-12-17 ENCOUNTER — Ambulatory Visit: Payer: Self-pay

## 2021-12-17 DIAGNOSIS — I10 Essential (primary) hypertension: Secondary | ICD-10-CM

## 2021-12-17 NOTE — Telephone Encounter (Signed)
  Chief Complaint: dizziness Symptoms: dizziness, feeling like fainting and legs get wobbly Frequency: ongoing for 5-6 years Pertinent Negatives: Patient denies fainiting Disposition: '[]'$ ED /'[]'$ Urgent Care (no appt availability in office) / '[]'$ Appointment(In office/virtual)/ '[]'$  [facility]/ '[]'$ Home Care/ '[]'$ Refused Recommended Disposition /'[x]'$ [facility]/ '[]'$  Follow-up with PCP Additional Notes: pt called in wanting to schedule appt to fu on dizziness since its been a while since he has seen PCP. Symptoms haven't gotten worse but last happened today. Pt states when it occurs he goes to sit down and rest, cool off and then goes away. Pt denies fainting. Advised mobile bus for next Wednesday since no appts available and pt is off on Wednesdays.   Reason for Disposition  Dizziness is a chronic symptom (recurrent or ongoing AND present > 4 weeks)  Answer Assessment - Initial Assessment Questions 1. DESCRIPTION: "Describe your dizziness."     dizziness 2. LIGHTHEADED: "Do you feel lightheaded?" (e.g., somewhat faint, woozy, weak upon standing)     Feeling like going to pass out  4. SEVERITY: "How bad is it?"  "Do you feel like you are going to faint?" "Can you stand and walk?"   - MILD: Feels slightly dizzy, but walking normally.   - MODERATE: Feels unsteady when walking, but not falling; interferes with normal activities (e.g., school, work).   - SEVERE: Unable to walk without falling, or requires assistance to walk without falling; feels like passing out now.      Mild to moderate 5. ONSET:  "When did the dizziness begin?"     Ongoing 5-6 years  10. OTHER SYMPTOMS: "Do you have any other symptoms?" (e.g., fever, chest pain, vomiting, diarrhea, bleeding)       Legs feel weak and wobble  Protocols used: Dizziness - Lightheadedness-A-AH

## 2021-12-22 NOTE — Telephone Encounter (Signed)
Pt called for refill request on 12/17/21 for Losartan Potassium. Pharmacist routed to Dr Margarita Rana and was not refilled at that time. Please expedite if appropriate. Pt out of med.

## 2022-03-17 ENCOUNTER — Ambulatory Visit: Payer: No Typology Code available for payment source | Admitting: Hematology and Oncology

## 2022-03-17 ENCOUNTER — Other Ambulatory Visit: Payer: Self-pay | Admitting: Family Medicine

## 2022-03-17 ENCOUNTER — Other Ambulatory Visit: Payer: No Typology Code available for payment source

## 2022-03-17 DIAGNOSIS — I10 Essential (primary) hypertension: Secondary | ICD-10-CM

## 2022-03-17 NOTE — Telephone Encounter (Signed)
Requested medication (s) are due for refill today - yes  Requested medication (s) are on the active medication list -yes  Future visit scheduled -yes  Last refill: 12/22/21  Notes to clinic: Patient has appointment scheduled 04/13/22- last RF has notes-sent for review of request  Requested Prescriptions  Pending Prescriptions Disp Refills   losartan (COZAAR) 100 MG tablet [Pharmacy Med Name: LOSARTAN '100MG'$  TABLETS] 90 tablet 0    Sig: TAKE 1 TABLET(100 MG) BY MOUTH DAILY     Cardiovascular:  Angiotensin Receptor Blockers Failed - 03/17/2022 10:45 AM      Failed - Last BP in normal range    BP Readings from Last 1 Encounters:  10/12/21 (!) 154/90         Failed - Valid encounter within last 6 months    Recent Outpatient Visits           1 year ago MGUS (monoclonal gammopathy of unknown significance)   [place], Charlane Ferretti, MD   1 year ago [place], [place], MD   1 year ago Pre-syncope   [place], [place]   5 years ago Essential hypertension   Primary Care at [place], [place]   6 years ago Essential hypertension   [facility] at [facility], Fenton Malling, MD       Future Appointments             In 3 weeks Charlott Rakes, MD [place] in normal range and within 180 days    Creatinine  Date Value Ref Range Status  09/14/2021 1.20 0.61 - 1.24 mg/dL Final   Creat  Date Value Ref Range Status  06/23/2015 1.23 0.70 - 1.33 mg/dL Final   Creatinine, Ser  Date Value Ref Range Status  10/11/2021 1.22 0.61 - 1.24 mg/dL Final         Passed - K in normal range and within 180 days    Potassium  Date Value Ref Range Status  10/11/2021 3.6 3.5 - 5.1 mmol/L Final         Passed - Patient is not pregnant         Requested Prescriptions  Pending Prescriptions Disp Refills    losartan (COZAAR) 100 MG tablet [Pharmacy Med Name: LOSARTAN '100MG'$  TABLETS] 90 tablet 0    Sig: TAKE 1 TABLET(100 MG) BY MOUTH DAILY     Cardiovascular:  Angiotensin Receptor Blockers Failed - 03/17/2022 10:45 AM      Failed - Last BP in normal range    BP Readings from Last 1 Encounters:  10/12/21 (!) 154/90         Failed - Valid encounter within last 6 months    Recent Outpatient Visits           1 year ago MGUS (monoclonal gammopathy of unknown significance)   [place], Charlane Ferretti, MD   1 year ago [place], [place], MD   1 year ago Pre-syncope   [place], MD   5 years ago Essential hypertension   Primary Care at [place], [place]   6 years ago Essential hypertension   Primary [facility] at [facility], Fenton Malling, MD  Future Appointments             In 3 weeks Charlott Rakes, MD [place] in normal range and within 180 days    Creatinine  Date Value Ref Range Status  09/14/2021 1.20 0.61 - 1.24 mg/dL Final   Creat  Date Value Ref Range Status  06/23/2015 1.23 0.70 - 1.33 mg/dL Final   Creatinine, Ser  Date Value Ref Range Status  10/11/2021 1.22 0.61 - 1.24 mg/dL Final         Passed - K in normal range and within 180 days    Potassium  Date Value Ref Range Status  10/11/2021 3.6 3.5 - 5.1 mmol/L Final         Passed - Patient is not pregnant

## 2022-03-17 NOTE — Telephone Encounter (Signed)
Refilled 03/17/2022 #[id number] rf. Requested Prescriptions  Pending Prescriptions Disp Refills  . losartan (COZAAR) 100 MG tablet [Pharmacy Med Name: LOSARTAN '100MG'$  TABLETS] 90 tablet     Sig: TAKE 1 TABLET(100 MG) BY MOUTH DAILY     Cardiovascular:  Angiotensin Receptor Blockers Failed - 03/17/2022 12:52 PM      Failed - Last BP in normal range    BP Readings from Last 1 Encounters:  10/12/21 (!) 154/90         Failed - Valid encounter within last 6 months    Recent Outpatient Visits          1 year ago MGUS (monoclonal gammopathy of unknown significance)   [place], Charlane Ferretti, MD   1 year ago [place], Charlane Ferretti, MD   1 year ago Pre-syncope   [place], MD   5 years ago Essential hypertension   Primary Care at [place], [place]   6 years ago Essential hypertension   Primary Care at [facility], Fenton Malling, MD      Future Appointments            In 3 weeks Charlott Rakes, MD [place] in normal range and within 180 days    Creatinine  Date Value Ref Range Status  09/14/2021 1.20 0.61 - 1.24 mg/dL Final   Creat  Date Value Ref Range Status  06/23/2015 1.23 0.70 - 1.33 mg/dL Final   Creatinine, Ser  Date Value Ref Range Status  10/11/2021 1.22 0.61 - 1.24 mg/dL Final         Passed - K in normal range and within 180 days    Potassium  Date Value Ref Range Status  10/11/2021 3.6 3.5 - 5.1 mmol/L Final         Passed - Patient is not pregnant

## 2022-03-19 ENCOUNTER — Other Ambulatory Visit: Payer: Self-pay | Admitting: Family Medicine

## 2022-03-19 DIAGNOSIS — I10 Essential (primary) hypertension: Secondary | ICD-10-CM

## 2022-03-30 ENCOUNTER — Other Ambulatory Visit: Payer: Self-pay | Admitting: Hematology and Oncology

## 2022-03-30 ENCOUNTER — Inpatient Hospital Stay: Payer: No Typology Code available for payment source | Admitting: Hematology and Oncology

## 2022-03-30 ENCOUNTER — Inpatient Hospital Stay: Payer: No Typology Code available for payment source | Attending: Hematology and Oncology

## 2022-03-30 DIAGNOSIS — D472 Monoclonal gammopathy: Secondary | ICD-10-CM

## 2022-03-30 LAB — CMP (CANCER CENTER ONLY)
ALT: 24 U/L (ref 0–44)
AST: 20 U/L (ref 15–41)
Albumin: 3.9 g/dL (ref 3.5–5.0)
Alkaline Phosphatase: 45 U/L (ref 38–126)
Anion gap: 8 (ref 5–15)
BUN: 16 mg/dL (ref 6–20)
CO2: 24 mmol/L (ref 22–32)
Calcium: 9.1 mg/dL (ref 8.9–10.3)
Chloride: 108 mmol/L (ref 98–111)
Creatinine: 1.3 mg/dL — ABNORMAL HIGH (ref 0.61–1.24)
GFR, Estimated: >60 mL/min (ref >60)
Glucose, Bld: 121 mg/dL — ABNORMAL HIGH (ref 70–99)
Potassium: 3.7 mmol/L (ref 3.5–5.1)
Sodium: 140 mmol/L (ref 135–145)
Total Bilirubin: 0.9 mg/dL (ref 0.3–1.2)
Total Protein: 7.5 g/dL (ref 6.5–8.1)

## 2022-03-30 LAB — CBC WITH DIFFERENTIAL (CANCER CENTER ONLY)
Abs Immature Granulocytes: 0.01 10*3/uL (ref 0.00–0.07)
Basophils Absolute: 0 10*3/uL (ref 0.0–0.1)
Basophils Relative: 0 %
Eosinophils Absolute: 0.1 10*3/uL (ref 0.0–0.5)
Eosinophils Relative: 1 %
HCT: 41.4 % (ref 39.0–52.0)
Hemoglobin: 14.5 g/dL (ref 13.0–17.0)
Immature Granulocytes: 0 %
Lymphocytes Relative: 20 %
Lymphs Abs: 0.9 10*3/uL (ref 0.7–4.0)
MCH: 32.7 pg (ref 26.0–34.0)
MCHC: 35 g/dL (ref 30.0–36.0)
MCV: 93.2 fL (ref 80.0–100.0)
Monocytes Absolute: 0.4 10*3/uL (ref 0.1–1.0)
Monocytes Relative: 9 %
Neutro Abs: 3.1 10*3/uL (ref 1.7–7.7)
Neutrophils Relative %: 70 %
Platelet Count: 315 10*3/uL (ref 150–400)
RBC: 4.44 MIL/uL (ref 4.22–5.81)
RDW: 12.4 % (ref 11.5–15.5)
WBC Count: 4.5 10*3/uL (ref 4.0–10.5)
nRBC: 0 % (ref 0.0–0.2)

## 2022-03-30 LAB — LACTATE DEHYDROGENASE: LDH: 112 U/L (ref 98–192)

## 2022-03-31 ENCOUNTER — Telehealth: Payer: Self-pay | Admitting: *Deleted

## 2022-03-31 LAB — KAPPA/LAMBDA LIGHT CHAINS
Kappa free light chain: 20.4 mg/L — ABNORMAL HIGH (ref 3.3–19.4)
Kappa, lambda light chain ratio: 1.35 (ref 0.26–1.65)
Lambda free light chains: 15.1 mg/L (ref 5.7–26.3)

## 2022-03-31 NOTE — Telephone Encounter (Signed)
Received vm message from pt asking for a call back. Attempted call back. No answer but was able to leave vm message for pt to return call at his convenience to [phone]

## 2022-04-01 NOTE — Telephone Encounter (Signed)
Received vm message from pt requesting a call back. TCT patient. No answer. Left vm message for pt to return this call to [phone]

## 2022-04-04 LAB — MULTIPLE MYELOMA PANEL, SERUM
Albumin SerPl Elph-Mcnc: 3.8 g/dL (ref 2.9–4.4)
Albumin/Glob SerPl: 1.2 (ref 0.7–1.7)
Alpha 1: 0.2 g/dL (ref 0.0–0.4)
Alpha2 Glob SerPl Elph-Mcnc: 0.5 g/dL (ref 0.4–1.0)
B-Globulin SerPl Elph-Mcnc: 1.2 g/dL (ref 0.7–1.3)
Gamma Glob SerPl Elph-Mcnc: 1.3 g/dL (ref 0.4–1.8)
Globulin, Total: 3.3 g/dL (ref 2.2–3.9)
IgA: 337 mg/dL (ref 90–386)
IgG (Immunoglobin G), Serum: 1372 mg/dL (ref 603–1613)
IgM (Immunoglobulin M), Srm: 103 mg/dL (ref 20–172)
Total Protein ELP: 7.1 g/dL (ref 6.0–8.5)

## 2022-04-09 NOTE — Progress Notes (Signed)
Patient left after labs, no visit occurred.

## 2022-04-13 ENCOUNTER — Ambulatory Visit: Payer: Self-pay | Attending: Family Medicine | Admitting: Family Medicine

## 2022-04-13 ENCOUNTER — Encounter: Payer: Self-pay | Admitting: Family Medicine

## 2022-04-13 VITALS — BP 179/93 | HR 93 | Temp 98.4°F | Ht 66.0 in | Wt 173.8 lb

## 2022-04-13 DIAGNOSIS — Z1211 Encounter for screening for malignant neoplasm of colon: Secondary | ICD-10-CM

## 2022-04-13 DIAGNOSIS — D472 Monoclonal gammopathy: Secondary | ICD-10-CM

## 2022-04-13 DIAGNOSIS — I10 Essential (primary) hypertension: Secondary | ICD-10-CM

## 2022-04-13 MED ORDER — LOSARTAN POTASSIUM 100 MG PO TABS: 100.0000 mg | ORAL_TABLET | Freq: Every day | ORAL | 1 refills | Status: DC

## 2022-04-13 MED ORDER — AMLODIPINE BESYLATE 5 MG PO TABS: 5.0000 mg | ORAL_TABLET | Freq: Every day | ORAL | 1 refills | Status: DC

## 2022-04-13 NOTE — Progress Notes (Signed)
Dizzy spells.

## 2022-04-13 NOTE — Patient Instructions (Signed)
Managing Your Hypertension Hypertension, also called high blood pressure, is when the force of the blood pressing against the walls of the arteries is too strong. Arteries are blood vessels that carry blood from your heart throughout your body. Hypertension forces the heart to work harder to pump blood and may cause the arteries to become narrow or stiff. Understanding blood pressure readings A blood pressure reading includes a higher number over a lower number: The first, or top, number is called the systolic pressure. It is a measure of the pressure in your arteries as your heart beats. The second, or bottom number, is called the diastolic pressure. It is a measure of the pressure in your arteries as the heart relaxes. For most people, a normal blood pressure is below 120/80. Your personal target blood pressure may vary depending on your medical conditions, your age, and other factors. Blood pressure is classified into four stages. Based on your blood pressure reading, your health care provider may use the following stages to determine what type of treatment you need, if any. Systolic pressure and diastolic pressure are measured in a unit called millimeters of mercury (mmHg). Normal Systolic pressure: below 120. Diastolic pressure: below 80. Elevated Systolic pressure: 120-129. Diastolic pressure: below 80. Hypertension stage 1 Systolic pressure: 130-139. Diastolic pressure: 80-89. Hypertension stage 2 Systolic pressure: 140 or above. Diastolic pressure: 90 or above. How can this condition affect me? Managing your hypertension is very important. Over time, hypertension can damage the arteries and decrease blood flow to parts of the body, including the brain, heart, and kidneys. Having untreated or uncontrolled hypertension can lead to: A heart attack. A stroke. A weakened blood vessel (aneurysm). Heart failure. Kidney damage. Eye damage. Memory and concentration problems. Vascular  dementia. What actions can I take to manage this condition? Hypertension can be managed by making lifestyle changes and possibly by taking medicines. Your health care provider will help you make a plan to bring your blood pressure within a normal range. You may be referred for counseling on a healthy diet and physical activity. Nutrition  Eat a diet that is high in fiber and potassium, and low in salt (sodium), added sugar, and fat. An example eating plan is called the DASH diet. DASH stands for Dietary Approaches to Stop Hypertension. To eat this way: Eat plenty of fresh fruits and vegetables. Try to fill one-half of your plate at each meal with fruits and vegetables. Eat whole grains, such as whole-wheat pasta, brown rice, or whole-grain bread. Fill about one-fourth of your plate with whole grains. Eat low-fat dairy products. Avoid fatty cuts of meat, processed or cured meats, and poultry with skin. Fill about one-fourth of your plate with lean proteins such as fish, chicken without skin, beans, eggs, and tofu. Avoid pre-made and processed foods. These tend to be higher in sodium, added sugar, and fat. Reduce your daily sodium intake. Many people with hypertension should eat less than 1,500 mg of sodium a day. Lifestyle  Work with your health care provider to maintain a healthy body weight or to lose weight. Ask what an ideal weight is for you. Get at least 30 minutes of exercise that causes your heart to beat faster (aerobic exercise) most days of the week. Activities may include walking, swimming, or biking. Include exercise to strengthen your muscles (resistance exercise), such as weight lifting, as part of your weekly exercise routine. Try to do these types of exercises for 30 minutes at least 3 days a week. Do   not use any products that contain nicotine or tobacco. These products include cigarettes, chewing tobacco, and vaping devices, such as e-cigarettes. If you need help quitting, ask your  health care provider. Control any long-term (chronic) conditions you have, such as high cholesterol or diabetes. Identify your sources of stress and find ways to manage stress. This may include meditation, deep breathing, or making time for fun activities. Alcohol use Do not drink alcohol if: Your health care provider tells you not to drink. You are pregnant, may be pregnant, or are planning to become pregnant. If you drink alcohol: Limit how much you have to: 0-1 drink a day for women. 0-2 drinks a day for men. Know how much alcohol is in your drink. In the [place] one drink equals one 12 oz bottle of beer (355 mL), one 5 oz glass of wine (148 mL), or one 1 oz glass of hard liquor (44 mL). Medicines Your health care provider may prescribe medicine if lifestyle changes are not enough to get your blood pressure under control and if: Your systolic blood pressure is 130 or higher. Your diastolic blood pressure is 80 or higher. Take medicines only as told by your health care provider. Follow the directions carefully. Blood pressure medicines must be taken as told by your health care provider. The medicine does not work as well when you skip doses. Skipping doses also puts you at risk for problems. Monitoring Before you monitor your blood pressure: Do not smoke, drink caffeinated beverages, or exercise within 30 minutes before taking a measurement. Use the bathroom and empty your bladder (urinate). Sit quietly for at least 5 minutes before taking measurements. Monitor your blood pressure at home as told by your health care provider. To do this: Sit with your back straight and supported. Place your feet flat on the floor. Do not cross your legs. Support your arm on a flat surface, such as a table. Make sure your upper arm is at heart level. Each time you measure, take two or three readings one minute apart and record the results. You may also need to have your blood pressure checked regularly by  your health care provider. General information Talk with your health care provider about your diet, exercise habits, and other lifestyle factors that may be contributing to hypertension. Review all the medicines you take with your health care provider because there may be side effects or interactions. Keep all follow-up visits. Your health care provider can help you create and adjust your plan for managing your high blood pressure. Where to find more information National Heart, Lung, and Blood Institute: www.nhlbi.nih.gov American Heart Association: www.heart.org Contact a health care provider if: You think you are having a reaction to medicines you have taken. You have repeated (recurrent) headaches. You feel dizzy. You have swelling in your ankles. You have trouble with your vision. Get help right away if: You develop a severe headache or confusion. You have unusual weakness or numbness, or you feel faint. You have severe pain in your chest or abdomen. You vomit repeatedly. You have trouble breathing. These symptoms may be an emergency. Get help right away. Call 911. Do not wait to see if the symptoms will go away. Do not drive yourself to the hospital. Summary Hypertension is when the force of blood pumping through your arteries is too strong. If this condition is not controlled, it may put you at risk for serious complications. Your personal target blood pressure may vary depending on your medical conditions,   your age, and other factors. For most people, a normal blood pressure is less than 120/80. Hypertension is managed by lifestyle changes, medicines, or both. Lifestyle changes to help manage hypertension include losing weight, eating a healthy, low-sodium diet, exercising more, stopping smoking, and limiting alcohol. This information is not intended to replace advice given to you by your health care provider. Make sure you discuss any questions you have with your health care  provider. Document Revised: 01/28/2021 Document Reviewed: 01/28/2021 Elsevier Patient Education  2023 Elsevier Inc.  

## 2022-04-13 NOTE — Progress Notes (Signed)
Subjective:  Patient ID: Joseph Adkins, male    DOB: [birth year]  Age: 56 y.o. MRN: [id number]  CC: Hypertension   HPI LOGON UTTECH is a 56 y.o. year old male with a history of hypertension,MGUS here for chronic disease management. He was last seen in the clinic in 10/2020.  Interval History: He is currently under the care of hematology for management of his MGUS.  He was supposed to have a visit last week but left before being seen. He has no back pain, bleeding or bruising.  His blood pressure is elevated and he endorses adherence with losartan.  Review of his last set of blood pressures reveal they have been elevated.  He tries exercise and tries to get as much as 10,000 steps per day.  Endorses adherence to low-sodium diet. Denies additional concerns today. Denies additional concerns today. Past Medical History:  Diagnosis Date   Hypertension     Past Surgical History:  Procedure Laterality Date   none      Family History  Problem Relation Age of Onset   Diabetes Mother    Hypertension Mother    Diabetes Maternal Grandmother    Diabetes Maternal Grandfather    Hypertension Maternal Grandfather    Hyperlipidemia Maternal Grandfather    Diabetes Paternal Grandmother    Diabetes Paternal Grandfather    Hypertension Paternal Grandfather     Social History   Socioeconomic History   Marital status: Single    Spouse name: Not on file   Number of children: Not on file   Years of education: Not on file   Highest education level: Not on file  Occupational History   Occupation: Administrator, arts    Employer: [employer]  Tobacco Use   Smoking status: Never   Smokeless tobacco: Never  Vaping Use   Vaping Use: Never used  Substance and Sexual Activity   Alcohol use: Yes    Alcohol/week: 12.0 standard drinks of alcohol    Types: 12 Cans of beer per week   Drug use: No   Sexual activity: Not Currently  Other Topics Concern   Not on file  Social History  Narrative   Right handed   Social Determinants of Health   Financial Resource Strain: Not on file  Food Insecurity: Not on file  Transportation Needs: Not on file  Physical Activity: Not on file  Stress: Not on file  Social Connections: Not on file    No Known Allergies  Outpatient Medications Prior to Visit  Medication Sig Dispense Refill   Flaxseed, Linseed, (FLAX SEED OIL) 1000 MG CAPS Take 1,000 mg by mouth daily.     losartan (COZAAR) 100 MG tablet TAKE 1 TABLET(100 MG) BY MOUTH DAILY 30 tablet 0   acetaminophen (TYLENOL) 500 MG tablet Take 500 mg by mouth every 6 (six) hours as needed for moderate pain or headache. (Patient not taking: Reported on 04/13/2022)     amLODipine (NORVASC) 5 MG tablet TAKE 1 TABLET(5 MG) BY MOUTH DAILY (Patient not taking: Reported on 04/13/2022) 30 tablet 0   ferrous sulfate 324 MG TBEC Take 324 mg by mouth daily with breakfast. (Patient not taking: Reported on 04/13/2022)     gabapentin (NEURONTIN) 300 MG capsule Take 1 capsule (300 mg total) by mouth at bedtime. (Patient not taking: Reported on 04/13/2022) 30 capsule 3   No facility-administered medications prior to visit.     ROS Review of Systems  Constitutional:  Negative for activity change and appetite  change.  HENT:  Negative for sinus pressure and sore throat.   Respiratory:  Negative for chest tightness, shortness of breath and wheezing.   Cardiovascular:  Negative for chest pain and palpitations.  Gastrointestinal:  Negative for abdominal distention, abdominal pain and constipation.  Genitourinary: Negative.   Musculoskeletal: Negative.   Psychiatric/Behavioral:  Negative for behavioral problems and dysphoric mood.     Objective:  BP (!) 179/93   Pulse 93   Temp 98.4 F (36.9 C) (Oral)   Ht '5\' 6"'$  (1.676 m)   Wt 173 lb 12.8 oz (78.8 kg)   SpO2 98%   BMI 28.05 kg/m      04/13/2022    1:43 PM 10/12/2021    2:04 AM 10/12/2021    1:00 AM  BP/Weight  Systolic BP 782 956  213  Diastolic BP 93 90 086  Wt. (Lbs) 173.8    BMI 28.05 kg/m2        Physical Exam Constitutional:      Appearance: He is well-developed.  Cardiovascular:     Rate and Rhythm: Normal rate.     Heart sounds: Normal heart sounds. No murmur heard. Pulmonary:     Effort: Pulmonary effort is normal.     Breath sounds: Normal breath sounds. No wheezing or rales.  Chest:     Chest wall: No tenderness.  Abdominal:     General: Bowel sounds are normal. There is no distension.     Palpations: Abdomen is soft. There is no mass.     Tenderness: There is no abdominal tenderness.  Musculoskeletal:        General: Normal range of motion.     Right lower leg: No edema.     Left lower leg: No edema.  Neurological:     Mental Status: He is alert and oriented to person, place, and time.  Psychiatric:        Mood and Affect: Mood normal.        Latest Ref Rng & Units 03/30/2022    2:38 PM 10/11/2021    8:49 PM 09/14/2021    8:17 AM  CMP  Glucose 70 - 99 mg/dL 121  113  116   BUN 6 - 20 mg/dL '16  16  16   '$ Creatinine 0.61 - 1.24 mg/dL 1.30  1.22  1.20   Sodium 135 - 145 mmol/L 140  138  140   Potassium 3.5 - 5.1 mmol/L 3.7  3.6  4.0   Chloride 98 - 111 mmol/L 108  109  110   CO2 22 - 32 mmol/L '24  23  22   '$ Calcium 8.9 - 10.3 mg/dL 9.1  9.0  9.3   Total Protein 6.5 - 8.1 g/dL 7.5   7.8   Total Bilirubin 0.3 - 1.2 mg/dL 0.9   0.5   Alkaline Phos 38 - 126 U/L 45   49   AST 15 - 41 U/L 20   16   ALT 0 - 44 U/L 24   14     Lipid Panel     Component Value Date/Time   CHOL 229 (H) 03/20/2020 1030   TRIG 40 03/20/2020 1030   HDL 87 03/20/2020 1030   CHOLHDL 2.6 03/20/2020 1030   CHOLHDL 2.7 06/23/2015 0942   VLDL 9 06/23/2015 0942   LDLCALC 135 (H) 03/20/2020 1030    CBC    Component Value Date/Time   WBC 4.5 03/30/2022 1438   WBC 5.3 10/11/2021 2049   RBC  4.44 03/30/2022 1438   HGB 14.5 03/30/2022 1438   HCT 41.4 03/30/2022 1438   PLT 315 03/30/2022 1438   MCV 93.2  03/30/2022 1438   MCH 32.7 03/30/2022 1438   MCHC 35.0 03/30/2022 1438   RDW 12.4 03/30/2022 1438   LYMPHSABS 0.9 03/30/2022 1438   MONOABS 0.4 03/30/2022 1438   EOSABS 0.1 03/30/2022 1438   BASOSABS 0.0 03/30/2022 1438    Lab Results  Component Value Date   HGBA1C 5.2 03/20/2020    Assessment & Plan:  1. Essential hypertension Uncontrolled Amlodipine to regimen Continue losartan Counseled on blood pressure goal of less than 130/80, low-sodium, DASH diet, medication compliance, 150 minutes of moderate intensity exercise per week. Discussed medication compliance, adverse effects. - amLODipine (NORVASC) 5 MG tablet; Take 1 tablet (5 mg total) by mouth daily.  Dispense: 90 tablet; Refill: 1 - losartan (COZAAR) 100 MG tablet; Take 1 tablet (100 mg total) by mouth daily.  Dispense: 90 tablet; Refill: 1  2. Screening for colon cancer - Cologuard  3. MGUS Asymptomatic Closely followed by hematology   No orders of the defined types were placed in this encounter.   Follow-up: Return in about 6 months (around 10/12/2022) for Chronic medical conditions.       Charlott Rakes, MD, FAAFP. [facility] and [place], [place]   04/13/2022, 1:44 PM

## 2022-04-25 ENCOUNTER — Telehealth: Payer: Self-pay | Admitting: *Deleted

## 2022-04-25 NOTE — Telephone Encounter (Signed)
-----   Message from Orson Slick, MD sent at 04/24/2022  6:10 PM EST ----- Please let Mr. Mchargue know that we are sorry we missed him at his last visit.  His MGUS labs are currently stable.  As long as he has no questions or concerns, I would recommend that he return to [place] in 6 months time with labs 1 week before.  Please send a message to schedulers requesting that visit. ----- Message ----- From: Buel Ream, [facility] [place] Sent: 03/30/2022   2:42 PM EST To: Orson Slick, MD

## 2022-04-25 NOTE — Telephone Encounter (Signed)
TCT patient regarding lab results. Spoke with him and advised that his MGUS labs are stable. Advised we will plan to see him in 6 months for clinic visit and labs 1 week prior to clinic visit. Pt voiced understanding.

## 2022-05-25 ENCOUNTER — Ambulatory Visit: Payer: Self-pay | Admitting: Family Medicine

## 2022-06-18 IMAGING — DX DG BONE SURVEY MET
10 series · 10 of 10 positions shown · non-contrast
Comparison: None.

CLINICAL DATA: 54-year-old male with monoclonal gammopathy.

EXAM:
METASTATIC BONE SURVEY

[skull lat]
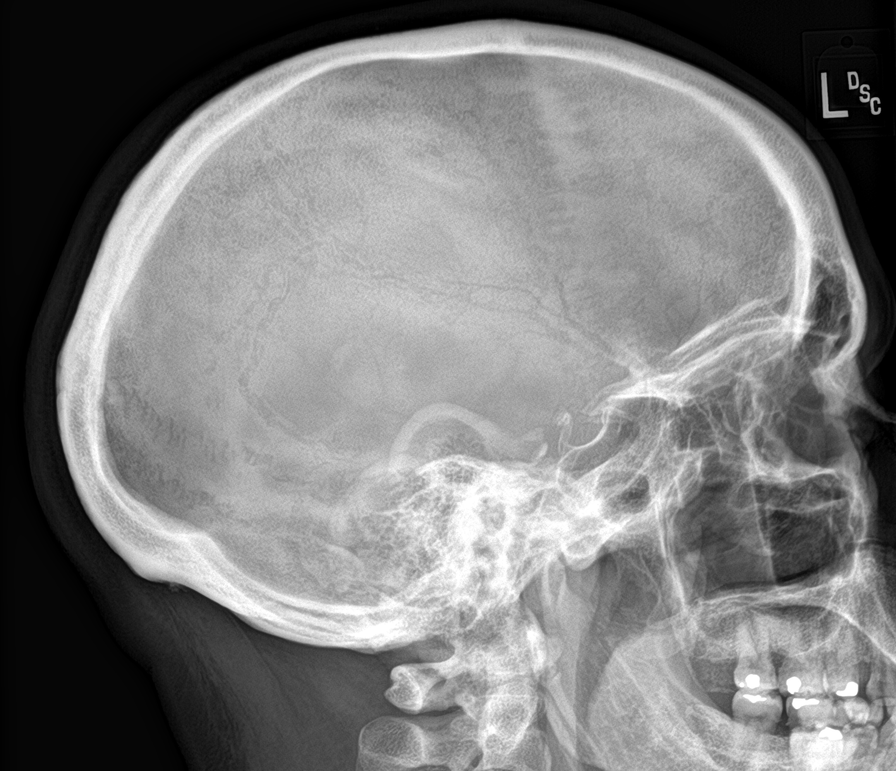

[shoulder ap (1 of 2)]
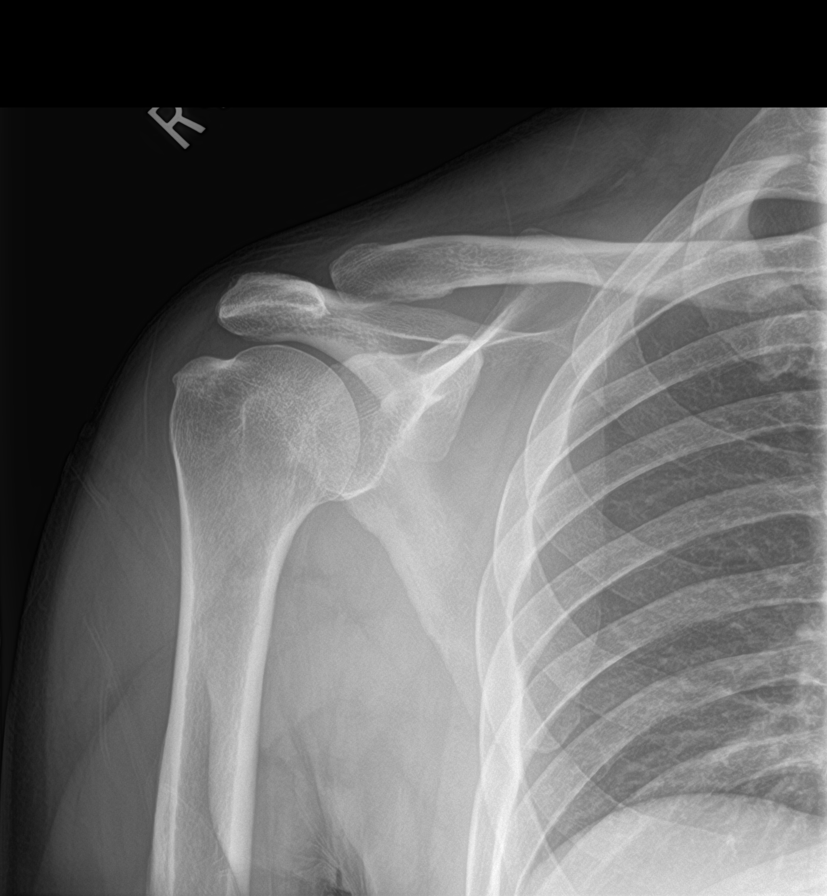

[shoulder ap (2 of 2)]
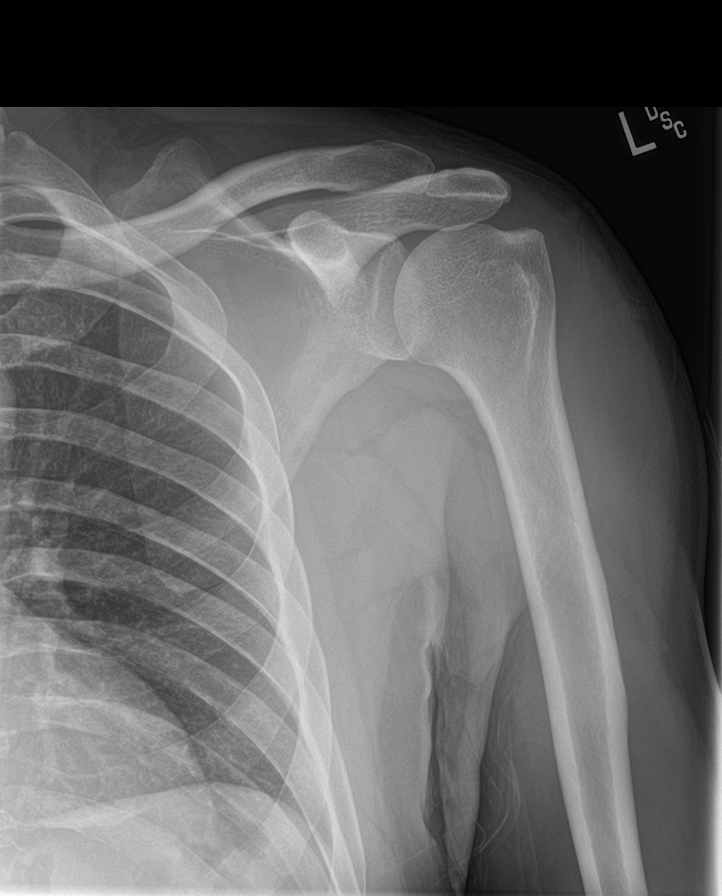

[humerus ap (1 of 2)]
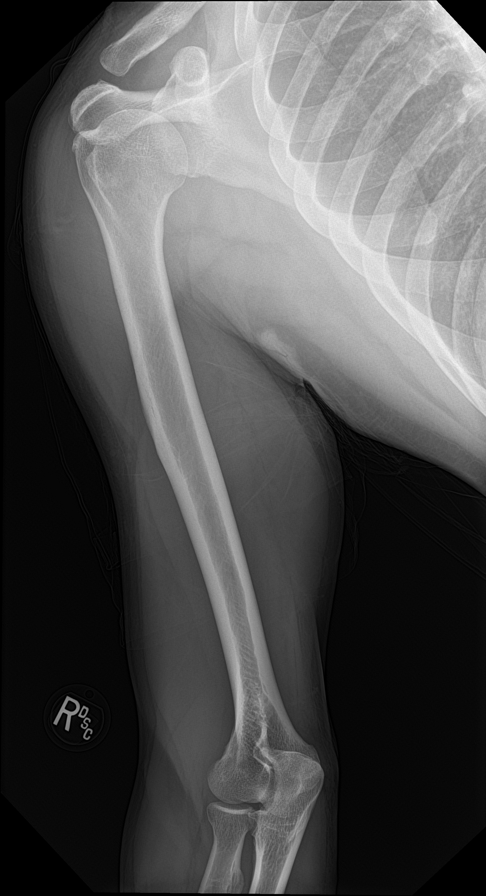

[humerus ap (2 of 2)]
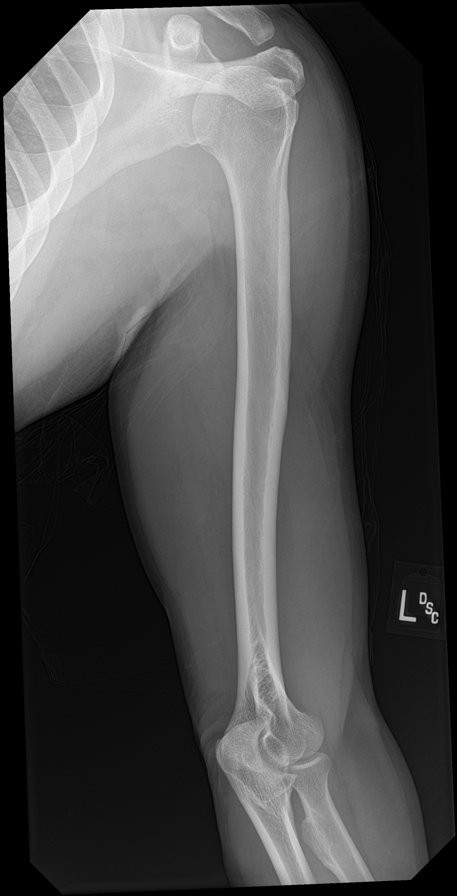

[forearm ap (1 of 2)]
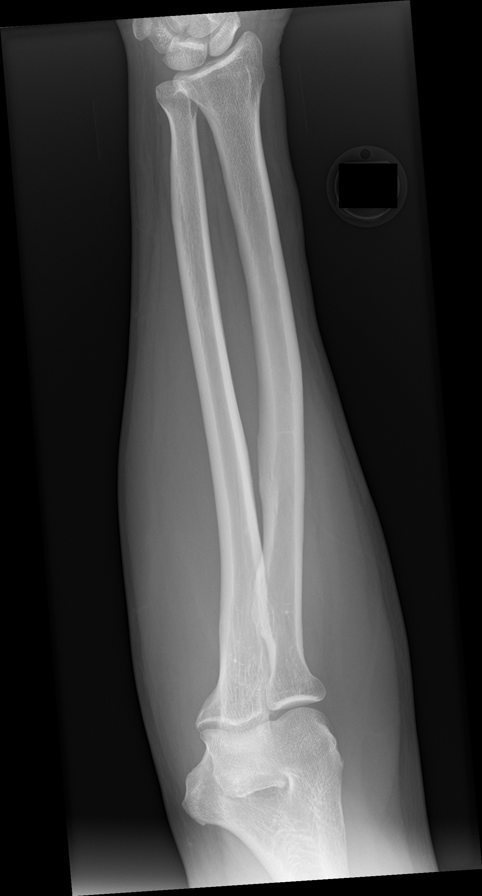

[forearm ap (2 of 2)]
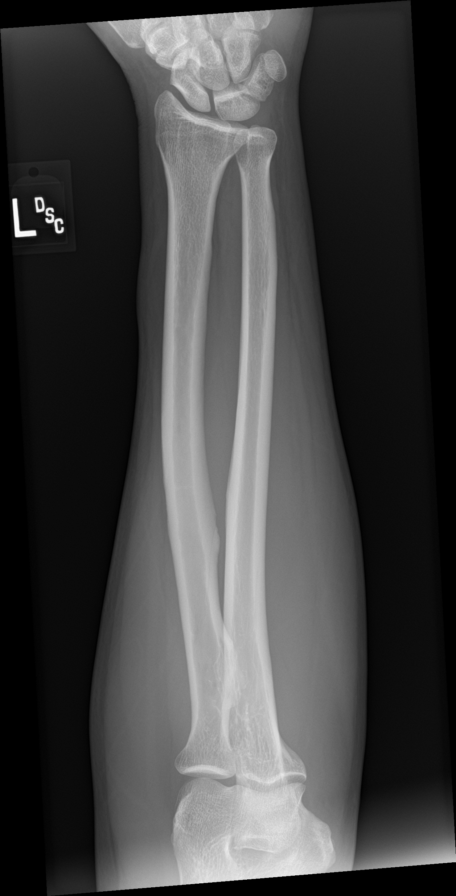

[c-spine ap]
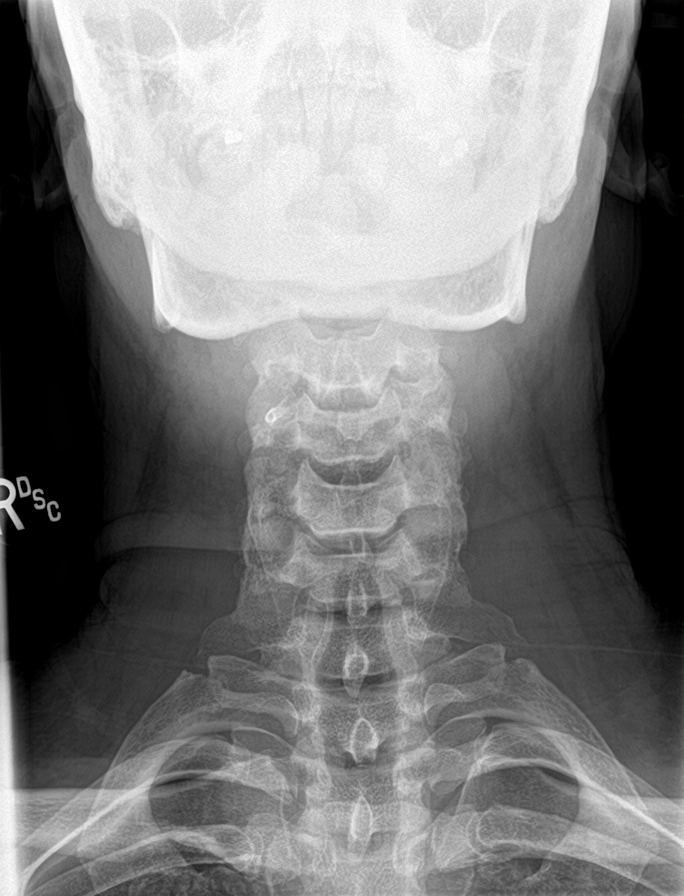

[c-spine lat]
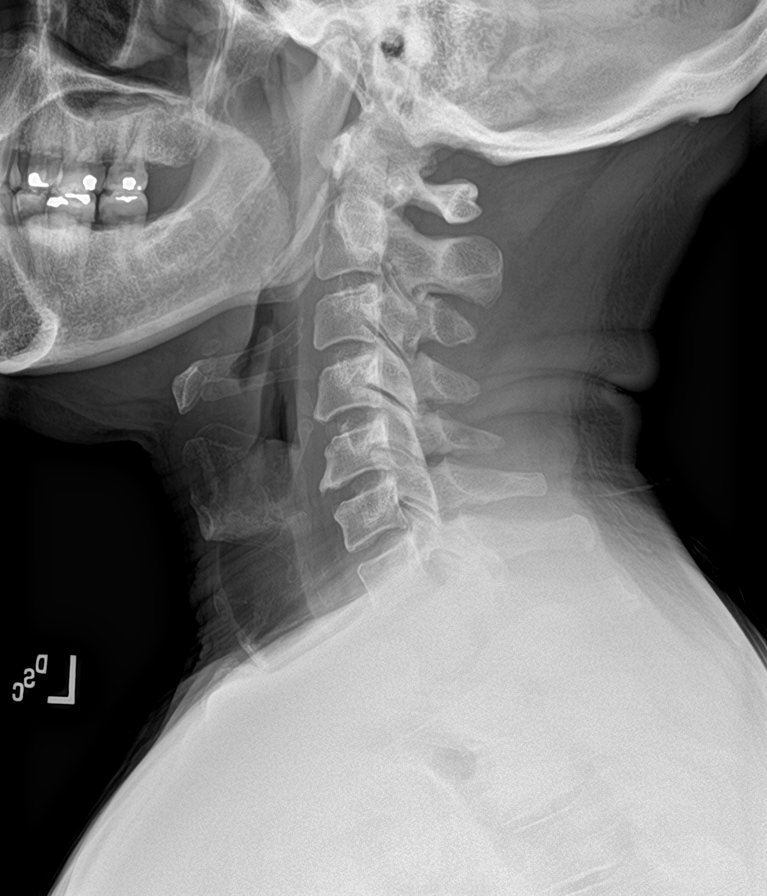

[t-spine ap]
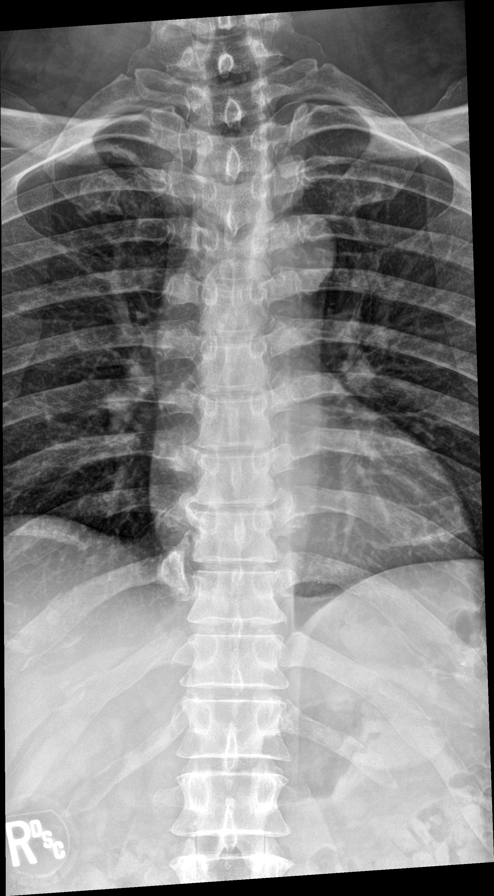

[10 of 10 positions shown; findings below may reference images not displayed]

FINDINGS: No lytic lesions are identified in the visualized axial or
appendicular skeleton. Prominent anterior disc osteophyte at L5-S1
with preserved disc space. No acute radiographic abnormality in the
chest or abdomen. Bilateral lower extremity atherosclerotic
calcifications are noted.
IMPRESSION: No lytic lesions visualized in the axial or appendicular skeleton.

## 2022-08-17 ENCOUNTER — Other Ambulatory Visit: Payer: Self-pay

## 2022-08-17 ENCOUNTER — Ambulatory Visit: Payer: Self-pay | Attending: Family Medicine | Admitting: Family Medicine

## 2022-08-17 ENCOUNTER — Emergency Department (HOSPITAL_COMMUNITY): Payer: Self-pay

## 2022-08-17 ENCOUNTER — Emergency Department (HOSPITAL_COMMUNITY)
Admission: EM | Admit: 2022-08-17 | Discharge: 2022-08-17 | Disposition: A | Discharge status: Home / Self Care | Payer: Self-pay | Attending: Emergency Medicine | Admitting: Emergency Medicine

## 2022-08-17 ENCOUNTER — Encounter: Payer: Self-pay | Admitting: Family Medicine

## 2022-08-17 VITALS — BP 162/110 | HR 116 | Temp 98.5°F | Ht 66.0 in | Wt 178.0 lb

## 2022-08-17 DIAGNOSIS — I10 Essential (primary) hypertension: Secondary | ICD-10-CM | POA: Insufficient documentation

## 2022-08-17 DIAGNOSIS — Z79899 Other long term (current) drug therapy: Secondary | ICD-10-CM | POA: Insufficient documentation

## 2022-08-17 DIAGNOSIS — T465X6A Underdosing of other antihypertensive drugs, initial encounter: Secondary | ICD-10-CM | POA: Insufficient documentation

## 2022-08-17 DIAGNOSIS — Z1211 Encounter for screening for malignant neoplasm of colon: Secondary | ICD-10-CM

## 2022-08-17 DIAGNOSIS — Z8249 Family history of ischemic heart disease and other diseases of the circulatory system: Secondary | ICD-10-CM | POA: Insufficient documentation

## 2022-08-17 DIAGNOSIS — R079 Chest pain, unspecified: Secondary | ICD-10-CM | POA: Insufficient documentation

## 2022-08-17 DIAGNOSIS — Z1159 Encounter for screening for other viral diseases: Secondary | ICD-10-CM

## 2022-08-17 LAB — BASIC METABOLIC PANEL
Anion gap: 14 (ref 5–15)
BUN: 20 mg/dL (ref 6–20)
CO2: 22 mmol/L (ref 22–32)
Calcium: 9.3 mg/dL (ref 8.9–10.3)
Chloride: 100 mmol/L (ref 98–111)
Creatinine, Ser: 1.3 mg/dL — ABNORMAL HIGH (ref 0.61–1.24)
GFR, Estimated: >60 mL/min (ref >60)
Glucose, Bld: 116 mg/dL — ABNORMAL HIGH (ref 70–99)
Potassium: 3.9 mmol/L (ref 3.5–5.1)
Sodium: 136 mmol/L (ref 135–145)

## 2022-08-17 LAB — CBC
HCT: 43.8 % (ref 39.0–52.0)
Hemoglobin: 15 g/dL (ref 13.0–17.0)
MCH: 32.7 pg (ref 26.0–34.0)
MCHC: 34.2 g/dL (ref 30.0–36.0)
MCV: 95.4 fL (ref 80.0–100.0)
Platelets: 328 10*3/uL (ref 150–400)
RBC: 4.59 MIL/uL (ref 4.22–5.81)
RDW: 13.2 % (ref 11.5–15.5)
WBC: 6.2 10*3/uL (ref 4.0–10.5)
nRBC: 0 % (ref 0.0–0.2)

## 2022-08-17 LAB — TROPONIN I (HIGH SENSITIVITY)
Troponin I (High Sensitivity): 6 ng/L (ref <18)
Troponin I (High Sensitivity): 9 ng/L (ref <18)

## 2022-08-17 MED ORDER — AMLODIPINE BESYLATE 5 MG PO TABS: 5.0000 mg | ORAL_TABLET | Freq: Every day | ORAL | 1 refills | Status: DC

## 2022-08-17 MED ORDER — ASPIRIN 325 MG PO TABS
325.0000 mg | ORAL_TABLET | Freq: Once | ORAL | Status: AC
  Administered 2022-08-17: 325 mg via ORAL

## 2022-08-17 MED ORDER — LOSARTAN POTASSIUM 100 MG PO TABS: 100.0000 mg | ORAL_TABLET | Freq: Every day | ORAL | 1 refills | Status: DC

## 2022-08-17 NOTE — ED Provider Triage Note (Signed)
Emergency Medicine Provider Triage Evaluation Note  Joseph Adkins , a 57 y.o. male  was evaluated in triage.  Pt referred to ED for further evaluation of chest pain and abnormal EKG.  He states he has had chest pain that's been ongoing for the past year, but worsened over the last month.  He states he does have some lightheadedness at times.  He was hypertensive at MD office and here, reports he has been taking meds as prescribed.  Denies dizziness, shortness of breath, nausea, vomiting, syncope.    Review of Systems  Positive: As above Negative: As above  Physical Exam  BP (!) 203/119   Pulse (!) 115   Temp 98.1 F (36.7 C) (Oral)   Resp 19   SpO2 100%  Gen:   Awake, no distress   Resp:  Normal effort  MSK:   Moves extremities without difficulty  Other:    Medical Decision Making  Medically screening exam initiated at 4:52 PM.  Appropriate orders placed.  Joseph Adkins was informed that the remainder of the evaluation will be completed by another provider, this initial triage assessment does not replace that evaluation, and the importance of remaining in the ED until their evaluation is complete.     Theressa Stamps R, [place] 08/17/22 [phone]

## 2022-08-17 NOTE — ED Provider Notes (Signed)
[facility] Provider Note   CSN: [id number] Arrival date & time: 08/17/22  1640     History  Chief Complaint  Patient presents with   Chest Pain    Joseph Adkins is a 57 y.o. male.  She reports he has had pain in the left side of his chest for the past year patient reports he has noticed increasing pain for the last month.  Patient went to his primary care doctor's office today and told them about the pain and was advised to come to the emergency department for evaluation.  Patient reports he is taking aspirin he took an aspirin at home this morning and then his doctor's office gave him an aspirin patient reports that he has seen Dr. Stanford Breed in the past.  Patient has past medical history of high blood pressure he is on Norvasc and losartan  The history is provided by the patient. No language interpreter was used.  Chest Pain Pain location:  L chest Pain quality: aching   Pain radiates to:  Does not radiate Pain severity:  Moderate      Home Medications Prior to Admission medications   Medication Sig Start Date End Date Taking? Authorizing Provider  amLODipine (NORVASC) 5 MG tablet Take 1 tablet (5 mg total) by mouth daily. 08/17/22   Charlott Rakes, MD  gabapentin (NEURONTIN) 300 MG capsule Take 1 capsule (300 mg total) by mouth at bedtime. 11/02/20   Charlott Rakes, MD  losartan (COZAAR) 100 MG tablet Take 1 tablet (100 mg total) by mouth daily. 08/17/22   Charlott Rakes, MD      Allergies    Patient has no known allergies.    Review of Systems   Review of Systems  Cardiovascular:  Positive for chest pain.  All other systems reviewed and are negative.   Physical Exam Updated Vital Signs BP (!) 180/99 (BP Location: Right Arm)   Pulse 78   Temp 98.1 F (36.7 C) (Oral)   Resp 18   SpO2 99%  Physical Exam Vitals and nursing note reviewed.  Constitutional:      Appearance: He is well-developed.  HENT:     Head:  Normocephalic.  Cardiovascular:     Rate and Rhythm: Normal rate and regular rhythm.     Heart sounds: Normal heart sounds.  Pulmonary:     Effort: Pulmonary effort is normal.     Breath sounds: Normal breath sounds.  Abdominal:     General: There is no distension.  Musculoskeletal:        General: Normal range of motion.     Cervical back: Normal range of motion.  Skin:    General: Skin is warm.  Neurological:     General: No focal deficit present.     Mental Status: He is alert and oriented to person, place, and time.     ED Results / Procedures / Treatments   Labs (all labs ordered are listed, but only abnormal results are displayed) Labs Reviewed  BASIC METABOLIC PANEL - Abnormal; Notable for the following components:      Result Value   Glucose, Bld 116 (*)    Creatinine, Ser 1.30 (*)    All other components within normal limits  CBC  TROPONIN I (HIGH SENSITIVITY)  TROPONIN I (HIGH SENSITIVITY)    EKG EKG Interpretation  Date/Time:  Wednesday August 17 2022 16:40:05 EDT Ventricular Rate:  111 PR Interval:  130 QRS Duration: 84 QT Interval:  330 QTC Calculation: 448 R Axis:   -21 Text Interpretation: Sinus tachycardia Biatrial enlargement Left ventricular hypertrophy ( R in aVL , Cornell product ) ST & T wave abnormality, consider lateral ischemia Abnormal ECG When compared with ECG of 17-Aug-2022 16:24, PREVIOUS ECG IS PRESENT when compared to prior,  similar to prior last month. No STEMI Confirmed by Antony Blackbird [phone]) on 08/17/2022 5:43:16 PM  Radiology DG Chest 2 View  Result Date: 08/17/2022 CLINICAL DATA:  chest pain EXAM: CHEST - 2 VIEW COMPARISON:  10/11/2021 FINDINGS: Cardiac silhouette is unremarkable. No pneumothorax or pleural effusion. The lungs are clear. The visualized skeletal structures are unremarkable. IMPRESSION: No acute cardiopulmonary process. Electronically Signed   By: Sammie Bench M.D.   On: 08/17/2022 17:15     Procedures Procedures    Medications Ordered in ED Medications - No data to display  ED Course/ Medical Decision Making/ A&P                             Medical Decision Making Patient complains of chest pain for the past year.  Patient reports pain has been worse for the past month  Amount and/or Complexity of Data Reviewed External Data Reviewed: notes.    Details: [facility] care notes reviewed Labs: ordered. Decision-making details documented in ED Course.    Details: Labs ordered reviewed and interpreted troponin is negative x 2 Radiology: ordered.    Details: Chest x-ray no cardiopulmonary abnormality ECG/medicine tests: ordered and independent interpretation performed. Decision-making details documented in ED Course.    Details: EKG no acute changes           Final Clinical Impression(s) / ED Diagnoses Final diagnoses:  Chest pain, unspecified type    Rx / DC Orders ED Discharge Orders     None      An After Visit Summary was printed and given to the patient.    Fransico Meadow, [place] 08/18/22 0011    Tegeler, Gwenyth Allegra, MD 08/18/22 [phone]

## 2022-08-17 NOTE — Progress Notes (Signed)
Still having on and off chest pains

## 2022-08-17 NOTE — ED Notes (Signed)
Eloped prior to discharge teaching.

## 2022-08-17 NOTE — Progress Notes (Signed)
Subjective:  Patient ID: Joseph Adkins, male    DOB: 05-02-1966  Age: 57 y.o. MRN: [id number]  CC: Hypertension   HPI Joseph Adkins is a 57 y.o. year old male with a history of hypertension,MGUS here for chronic disease management.   Interval History:  He has only been taking Losartan but has not been taking Amlodipine even though it was sent to the Pharmacy.He has cut out sodium from his diet. He walks daily. He has had intermittent chest pain for the last month and denies presence of dyspnea, palpitations, diaphoresis.  Chest pain does not radiate.  Last episode of chest pain was today but he denies presence of chest pain at the moment.  Past Medical History:  Diagnosis Date   Hypertension     Past Surgical History:  Procedure Laterality Date   none      Family History  Problem Relation Age of Onset   Diabetes Mother    Hypertension Mother    Diabetes Maternal Grandmother    Diabetes Maternal Grandfather    Hypertension Maternal Grandfather    Hyperlipidemia Maternal Grandfather    Diabetes Paternal Grandmother    Diabetes Paternal Grandfather    Hypertension Paternal Grandfather     Social History   Socioeconomic History   Marital status: Single    Spouse name: Not on file   Number of children: Not on file   Years of education: Not on file   Highest education level: Not on file  Occupational History   Occupation: Administrator, arts    Employer: [employer]  Tobacco Use   Smoking status: Never   Smokeless tobacco: Never  Vaping Use   Vaping Use: Never used  Substance and Sexual Activity   Alcohol use: Yes    Alcohol/week: 12.0 standard drinks of alcohol    Types: 12 Cans of beer per week   Drug use: No   Sexual activity: Not Currently  Other Topics Concern   Not on file  Social History Narrative   Right handed   Social Determinants of Health   Financial Resource Strain: Not on file  Food Insecurity: Not on file  Transportation Needs:  Not on file  Physical Activity: Not on file  Stress: Not on file  Social Connections: Not on file    No Known Allergies  Outpatient Medications Prior to Visit  Medication Sig Dispense Refill   losartan (COZAAR) 100 MG tablet Take 1 tablet (100 mg total) by mouth daily. 90 tablet 1   gabapentin (NEURONTIN) 300 MG capsule Take 1 capsule (300 mg total) by mouth at bedtime. 30 capsule 3   amLODipine (NORVASC) 5 MG tablet Take 1 tablet (5 mg total) by mouth daily. (Patient not taking: Reported on 08/17/2022) 90 tablet 1   No facility-administered medications prior to visit.     ROS Review of Systems  Constitutional:  Negative for activity change and appetite change.  HENT:  Negative for sinus pressure and sore throat.   Respiratory:  Negative for chest tightness, shortness of breath and wheezing.   Cardiovascular:  Negative for chest pain and palpitations.  Gastrointestinal:  Negative for abdominal distention, abdominal pain and constipation.  Genitourinary: Negative.   Musculoskeletal: Negative.   Psychiatric/Behavioral:  Negative for behavioral problems and dysphoric mood.     Objective:  BP (!) 162/110   Pulse (!) 116   Temp 98.5 F (36.9 C) (Oral)   Ht 5\' 6"  (1.676 m)   Wt 178 lb (80.7 kg)  SpO2 97%   BMI 28.73 kg/m      08/17/2022    4:26 PM 08/17/2022    3:21 PM 04/13/2022    1:43 PM  BP/Weight  Systolic BP 0000000 [id number] 0000000  Diastolic BP [id number] 93  Wt. (Lbs)  178 173.8  BMI  28.73 kg/m2 28.05 kg/m2      Physical Exam Constitutional:      Appearance: He is well-developed.  Cardiovascular:     Rate and Rhythm: Normal rate.     Heart sounds: Normal heart sounds. No murmur heard. Pulmonary:     Effort: Pulmonary effort is normal.     Breath sounds: Normal breath sounds. No wheezing or rales.  Chest:     Chest wall: No tenderness.  Abdominal:     General: Bowel sounds are normal. There is no distension.     Palpations: Abdomen is soft. There is no mass.      Tenderness: There is no abdominal tenderness.  Musculoskeletal:        General: Normal range of motion.     Right lower leg: No edema.     Left lower leg: No edema.  Neurological:     Mental Status: He is alert and oriented to person, place, and time.  Psychiatric:        Mood and Affect: Mood normal.        Latest Ref Rng & Units 03/30/2022    2:38 PM 10/11/2021    8:49 PM 09/14/2021    8:17 AM  CMP  Glucose 70 - 99 mg/dL 121  113  116   BUN 6 - 20 mg/dL 16  16  16    Creatinine 0.61 - 1.24 mg/dL 1.30  1.22  1.20   Sodium 135 - 145 mmol/L 140  138  140   Potassium 3.5 - 5.1 mmol/L 3.7  3.6  4.0   Chloride 98 - 111 mmol/L 108  109  110   CO2 22 - 32 mmol/L 24  23  22    Calcium 8.9 - 10.3 mg/dL 9.1  9.0  9.3   Total Protein 6.5 - 8.1 g/dL 7.5   7.8   Total Bilirubin 0.3 - 1.2 mg/dL 0.9   0.5   Alkaline Phos 38 - 126 U/L 45   49   AST 15 - 41 U/L 20   16   ALT 0 - 44 U/L 24   14     Lipid Panel     Component Value Date/Time   CHOL 229 (H) 03/20/2020 1030   TRIG 40 03/20/2020 1030   HDL 87 03/20/2020 1030   CHOLHDL 2.6 03/20/2020 1030   CHOLHDL 2.7 06/23/2015 0942   VLDL 9 06/23/2015 0942   LDLCALC 135 (H) 03/20/2020 1030    CBC    Component Value Date/Time   WBC 4.5 03/30/2022 1438   WBC 5.3 10/11/2021 2049   RBC 4.44 03/30/2022 1438   HGB 14.5 03/30/2022 1438   HCT 41.4 03/30/2022 1438   PLT 315 03/30/2022 1438   MCV 93.2 03/30/2022 1438   MCH 32.7 03/30/2022 1438   MCHC 35.0 03/30/2022 1438   RDW 12.4 03/30/2022 1438   LYMPHSABS 0.9 03/30/2022 1438   MONOABS 0.4 03/30/2022 1438   EOSABS 0.1 03/30/2022 1438   BASOSABS 0.0 03/30/2022 1438    Lab Results  Component Value Date   HGBA1C 5.2 03/20/2020    Assessment & Plan:  1. Chest pain, unspecified type Asymptomatic at the time but chest  pain occurred earlier today and over the last couple of days EKG findings reveal LVH, cannot rule out septal infarct, lateral ischemia, incomplete RBBB His EKG is  suspicious for possible ST elevation in anterior leads I am referring him to the ED for further evaluation as he needs troponins and further workup ASA 325mg  administered  2. Essential hypertension Uncontrolled due to the fact that he has not been taking amlodipine but just taking losartan Prescription sent to the pharmacy - CMP14+EGFR - amLODipine (NORVASC) 5 MG tablet; Take 1 tablet (5 mg total) by mouth daily.  Dispense: 90 tablet; Refill: 1 - losartan (COZAAR) 100 MG tablet; Take 1 tablet (100 mg total) by mouth daily.  Dispense: 90 tablet; Refill: 1  3. Screening for colon cancer - Fecal occult blood, imunochemical([facility])  4. Screening for viral disease  - HCV Ab w Reflex to Quant PCR    Meds ordered this encounter  Medications   amLODipine (NORVASC) 5 MG tablet    Sig: Take 1 tablet (5 mg total) by mouth daily.    Dispense:  90 tablet    Refill:  1   losartan (COZAAR) 100 MG tablet    Sig: Take 1 tablet (100 mg total) by mouth daily.    Dispense:  90 tablet    Refill:  1   aspirin tablet 325 mg    Follow-up: Return in about 2 weeks (around 08/31/2022) for Blood Pressure follow-up.       Charlott Rakes, MD, FAAFP. [facility] [place], [place]   08/17/2022, 4:34 PM

## 2022-08-17 NOTE — Patient Instructions (Signed)
Please go to the ED due to your abnormal EKG findings.

## 2022-08-17 NOTE — ED Provider Notes (Signed)
[facility] Provider Note   CSN: [id number] Arrival date & time: 08/17/22  1640     History {Add pertinent medical, surgical, social history, OB history to HPI:1} Chief Complaint  Patient presents with   Chest Pain    Joseph Adkins is a 57 y.o. male.  Patient reports he has been having chest pain on the left side for the past month.  Patient was seen in his primary care doctor's office today and advised to come to the emergency department for evaluation.  Patient reports that he has been seen by [facility] in the past.  Patient reports that he is on amlodipine and losartan for his blood pressure.  The history is provided by the patient. No language interpreter was used.  Chest Pain Pain location:  L chest Pain quality: aching   Pain radiates to:  Does not radiate Pain severity:  Moderate Timing:  Constant Progression:  Worsening Chronicity:  New Relieved by:  Nothing Worsened by:  Nothing Ineffective treatments:  None tried Associated symptoms: no nausea   Risk factors: hypertension        Home Medications Prior to Admission medications   Medication Sig Start Date End Date Taking? Authorizing Provider  amLODipine (NORVASC) 5 MG tablet Take 1 tablet (5 mg total) by mouth daily. 08/17/22   Charlott Rakes, MD  gabapentin (NEURONTIN) 300 MG capsule Take 1 capsule (300 mg total) by mouth at bedtime. 11/02/20   Charlott Rakes, MD  losartan (COZAAR) 100 MG tablet Take 1 tablet (100 mg total) by mouth daily. 08/17/22   Charlott Rakes, MD      Allergies    Patient has no known allergies.    Review of Systems   Review of Systems  Cardiovascular:  Positive for chest pain.  Gastrointestinal:  Negative for nausea.  All other systems reviewed and are negative.   Physical Exam Updated Vital Signs BP (!) 180/99 (BP Location: Right Arm)   Pulse 78   Temp 98.1 F (36.7 C) (Oral)   Resp 18   SpO2 99%  Physical Exam Vitals and  nursing note reviewed.  Constitutional:      Appearance: He is well-developed.  HENT:     Head: Normocephalic.  Cardiovascular:     Rate and Rhythm: Normal rate and regular rhythm.     Heart sounds: Normal heart sounds.  Pulmonary:     Effort: Pulmonary effort is normal.     Breath sounds: Normal breath sounds.  Abdominal:     General: There is no distension.  Musculoskeletal:        General: Normal range of motion.     Cervical back: Normal range of motion.  Skin:    General: Skin is warm.  Neurological:     General: No focal deficit present.     Mental Status: He is alert and oriented to person, place, and time.     ED Results / Procedures / Treatments   Labs (all labs ordered are listed, but only abnormal results are displayed) Labs Reviewed  BASIC METABOLIC PANEL - Abnormal; Notable for the following components:      Result Value   Glucose, Bld 116 (*)    Creatinine, Ser 1.30 (*)    All other components within normal limits  CBC  TROPONIN I (HIGH SENSITIVITY)  TROPONIN I (HIGH SENSITIVITY)    EKG EKG Interpretation  Date/Time:  Wednesday August 17 2022 16:40:05 EDT Ventricular Rate:  111 PR Interval:  130 QRS Duration: 84 QT Interval:  330 QTC Calculation: 448 R Axis:   -21 Text Interpretation: Sinus tachycardia Biatrial enlargement Left ventricular hypertrophy ( R in aVL , Cornell product ) ST & T wave abnormality, consider lateral ischemia Abnormal ECG When compared with ECG of 17-Aug-2022 16:24, PREVIOUS ECG IS PRESENT when compared to prior,  similar to prior last month. No STEMI Confirmed by Antony Blackbird [phone]) on 08/17/2022 5:43:16 PM  Radiology DG Chest 2 View  Result Date: 08/17/2022 CLINICAL DATA:  chest pain EXAM: CHEST - 2 VIEW COMPARISON:  10/11/2021 FINDINGS: Cardiac silhouette is unremarkable. No pneumothorax or pleural effusion. The lungs are clear. The visualized skeletal structures are unremarkable. IMPRESSION: No acute cardiopulmonary  process. Electronically Signed   By: Sammie Bench M.D.   On: 08/17/2022 17:15    Procedures Procedures  {Document cardiac monitor, telemetry assessment procedure when appropriate:1}  Medications Ordered in ED Medications - No data to display  ED Course/ Medical Decision Making/ A&P   {   Click here for ABCD2, HEART and other calculatorsREFRESH Note before signing :1}                          Medical Decision Making Patient complains of pain in the left side of his chest.  Patient reports that he  Amount and/or Complexity of Data Reviewed Labs: ordered. Radiology: ordered.   ***  {Document critical care time when appropriate:1} {Document review of labs and clinical decision tools ie heart score, Chads2Vasc2 etc:1}  {Document your independent review of radiology images, and any outside records:1} {Document your discussion with family members, caretakers, and with consultants:1} {Document social determinants of health affecting pt's care:1} {Document your decision making why or why not admission, treatments were needed:1} Final Clinical Impression(s) / ED Diagnoses Final diagnoses:  None    Rx / DC Orders ED Discharge Orders     None

## 2022-08-17 NOTE — ED Provider Notes (Incomplete)
[facility] Provider Note   CSN: [id number] Arrival date & time: 08/17/22  1640     History {Add pertinent medical, surgical, social history, OB history to HPI:1} Chief Complaint  Patient presents with  . Chest Pain    Joseph Adkins is a 57 y.o. male.   Chest Pain      Home Medications Prior to Admission medications   Medication Sig Start Date End Date Taking? Authorizing Provider  amLODipine (NORVASC) 5 MG tablet Take 1 tablet (5 mg total) by mouth daily. 08/17/22   Charlott Rakes, MD  gabapentin (NEURONTIN) 300 MG capsule Take 1 capsule (300 mg total) by mouth at bedtime. 11/02/20   Charlott Rakes, MD  losartan (COZAAR) 100 MG tablet Take 1 tablet (100 mg total) by mouth daily. 08/17/22   Charlott Rakes, MD      Allergies    Patient has no known allergies.    Review of Systems   Review of Systems  Cardiovascular:  Positive for chest pain.    Physical Exam Updated Vital Signs BP (!) 180/99 (BP Location: Right Arm)   Pulse 78   Temp 98.1 F (36.7 C) (Oral)   Resp 18   SpO2 99%  Physical Exam  ED Results / Procedures / Treatments   Labs (all labs ordered are listed, but only abnormal results are displayed) Labs Reviewed  BASIC METABOLIC PANEL - Abnormal; Notable for the following components:      Result Value   Glucose, Bld 116 (*)    Creatinine, Ser 1.30 (*)    All other components within normal limits  CBC  TROPONIN I (HIGH SENSITIVITY)  TROPONIN I (HIGH SENSITIVITY)    EKG EKG Interpretation  Date/Time:  Wednesday August 17 2022 16:40:05 EDT Ventricular Rate:  111 PR Interval:  130 QRS Duration: 84 QT Interval:  330 QTC Calculation: 448 R Axis:   -21 Text Interpretation: Sinus tachycardia Biatrial enlargement Left ventricular hypertrophy ( R in aVL , Cornell product ) ST & T wave abnormality, consider lateral ischemia Abnormal ECG When compared with ECG of 17-Aug-2022 16:24, PREVIOUS ECG IS  PRESENT when compared to prior,  similar to prior last month. No STEMI Confirmed by Antony Blackbird [phone]) on 08/17/2022 5:43:16 PM  Radiology DG Chest 2 View  Result Date: 08/17/2022 CLINICAL DATA:  chest pain EXAM: CHEST - 2 VIEW COMPARISON:  10/11/2021 FINDINGS: Cardiac silhouette is unremarkable. No pneumothorax or pleural effusion. The lungs are clear. The visualized skeletal structures are unremarkable. IMPRESSION: No acute cardiopulmonary process. Electronically Signed   By: Sammie Bench M.D.   On: 08/17/2022 17:15    Procedures Procedures  {Document cardiac monitor, telemetry assessment procedure when appropriate:1}  Medications Ordered in ED Medications - No data to display  ED Course/ Medical Decision Making/ A&P   {   Click here for ABCD2, HEART and other calculatorsREFRESH Note before signing :1}                          Medical Decision Making Amount and/or Complexity of Data Reviewed Labs: ordered. Radiology: ordered.   ***  {Document critical care time when appropriate:1} {Document review of labs and clinical decision tools ie heart score, Chads2Vasc2 etc:1}  {Document your independent review of radiology images, and any outside records:1} {Document your discussion with family members, caretakers, and with consultants:1} {Document social determinants of health affecting pt's care:1} {Document your decision making why or why not  admission, treatments were needed:1} Final Clinical Impression(s) / ED Diagnoses Final diagnoses:  Chest pain, unspecified type    Rx / DC Orders ED Discharge Orders     None

## 2022-08-17 NOTE — ED Triage Notes (Signed)
Patient referred to ED for further evaluation of chest pain and an abnormal EKG. Patient reports chest pain started approximately on year ago but go worse one month ago. Patient denies dizziness, denies shortness of breath, denies nausea. Patient is alert, oriented, and in no apparent distress at this time.

## 2022-08-24 ENCOUNTER — Other Ambulatory Visit: Payer: Self-pay | Admitting: Family Medicine

## 2022-08-24 DIAGNOSIS — I10 Essential (primary) hypertension: Secondary | ICD-10-CM

## 2022-08-24 NOTE — Telephone Encounter (Signed)
Medication Refill - Medication: amLODipine (NORVASC) 5 MG tablet  Shows sent 03/20, pt says pharmacy didn't have it  Has the patient contacted their pharmacy? yes (Agent: If no, request that the patient contact the pharmacy for the refill. If patient does not wish to contact the pharmacy document the reason why and proceed with request.) (Agent: If yes, when and what did the pharmacy advise?)contact pcp  Preferred Pharmacy (with phone number or street name):  [facility] [place], [address] [place] Phone: [phone]  Fax: [phone]     Has the patient been seen for an appointment in the last year OR does the patient have an upcoming appointment? yes  Agent: Please be advised that RX refills may take up to 3 business days. We ask that you follow-up with your pharmacy.

## 2022-08-25 NOTE — Telephone Encounter (Signed)
Unable to refill per protocol, Rx request is too soon. Last refill 08/17/22 for 90 and 1 refill.  Requested Prescriptions  Pending Prescriptions Disp Refills   amLODipine (NORVASC) 5 MG tablet 90 tablet 1    Sig: Take 1 tablet (5 mg total) by mouth daily.     Cardiovascular: Calcium Channel Blockers 2 Failed - 08/24/2022  2:29 PM      Failed - Last BP in normal range    BP Readings from Last 1 Encounters:  08/17/22 (!) 180/99         Passed - Last Heart Rate in normal range    Pulse Readings from Last 1 Encounters:  08/17/22 78         Passed - Valid encounter within last 6 months    Recent Outpatient Visits           1 week ago Chest pain, unspecified type   [place] Charlott Rakes, MD   4 months ago Screening for colon cancer   [place], [place], MD   1 year ago MGUS (monoclonal gammopathy of unknown significance)   [place], Charlane Ferretti, MD   2 years ago [place] Charlott Rakes, MD   2 years ago [place] Antony Blackbird, MD       Future Appointments             In 2 weeks Charlott Rakes, MD [place]

## 2022-09-12 ENCOUNTER — Ambulatory Visit: Payer: Self-pay | Attending: Family Medicine | Admitting: Family Medicine

## 2022-10-18 ENCOUNTER — Other Ambulatory Visit: Payer: Self-pay | Admitting: *Deleted

## 2022-10-18 DIAGNOSIS — D472 Monoclonal gammopathy: Secondary | ICD-10-CM

## 2022-10-19 ENCOUNTER — Inpatient Hospital Stay: Payer: Self-pay | Attending: Family Medicine

## 2022-10-25 ENCOUNTER — Telehealth: Payer: Self-pay | Admitting: Hematology and Oncology

## 2022-10-26 ENCOUNTER — Inpatient Hospital Stay: Payer: Self-pay | Admitting: Hematology and Oncology

## 2022-10-26 ENCOUNTER — Inpatient Hospital Stay: Payer: Self-pay

## 2023-03-29 ENCOUNTER — Other Ambulatory Visit: Payer: Self-pay | Admitting: Family Medicine

## 2023-03-29 DIAGNOSIS — I10 Essential (primary) hypertension: Secondary | ICD-10-CM

## 2023-07-07 ENCOUNTER — Emergency Department (HOSPITAL_COMMUNITY): Payer: Self-pay

## 2023-07-07 ENCOUNTER — Encounter (HOSPITAL_COMMUNITY): Payer: Self-pay | Admitting: Emergency Medicine

## 2023-07-07 ENCOUNTER — Emergency Department (HOSPITAL_COMMUNITY)
Admission: EM | Admit: 2023-07-07 | Discharge: 2023-07-07 | Disposition: A | Discharge status: Home / Self Care | Payer: Self-pay | Attending: Emergency Medicine | Admitting: Emergency Medicine

## 2023-07-07 DIAGNOSIS — I10 Essential (primary) hypertension: Secondary | ICD-10-CM | POA: Insufficient documentation

## 2023-07-07 DIAGNOSIS — Z79899 Other long term (current) drug therapy: Secondary | ICD-10-CM | POA: Insufficient documentation

## 2023-07-07 LAB — CBC
HCT: 40.8 % (ref 39.0–52.0)
Hemoglobin: 14.3 g/dL (ref 13.0–17.0)
MCH: 32.5 pg (ref 26.0–34.0)
MCHC: 35 g/dL (ref 30.0–36.0)
MCV: 92.7 fL (ref 80.0–100.0)
Platelets: 304 10*3/uL (ref 150–400)
RBC: 4.4 MIL/uL (ref 4.22–5.81)
RDW: 13.4 % (ref 11.5–15.5)
WBC: 4.4 10*3/uL (ref 4.0–10.5)
nRBC: 0 % (ref 0.0–0.2)

## 2023-07-07 LAB — TROPONIN I (HIGH SENSITIVITY)
Troponin I (High Sensitivity): 5 ng/L (ref <18)
Troponin I (High Sensitivity): 6 ng/L (ref <18)

## 2023-07-07 LAB — BASIC METABOLIC PANEL
Anion gap: 13 (ref 5–15)
BUN: 15 mg/dL (ref 6–20)
CO2: 22 mmol/L (ref 22–32)
Calcium: 9.2 mg/dL (ref 8.9–10.3)
Chloride: 101 mmol/L (ref 98–111)
Creatinine, Ser: 1.32 mg/dL — ABNORMAL HIGH (ref 0.61–1.24)
GFR, Estimated: >60 mL/min (ref >60)
Glucose, Bld: 146 mg/dL — ABNORMAL HIGH (ref 70–99)
Potassium: 3.9 mmol/L (ref 3.5–5.1)
Sodium: 136 mmol/L (ref 135–145)

## 2023-07-07 MED ORDER — HYDRALAZINE HCL 20 MG/ML IJ SOLN
5.0000 mg | Freq: Once | INTRAMUSCULAR | Status: AC
  Administered 2023-07-07: 5 mg via INTRAVENOUS
  Filled 2023-07-07: qty 1

## 2023-07-07 MED ORDER — LOSARTAN POTASSIUM 50 MG PO TABS
100.0000 mg | ORAL_TABLET | Freq: Every day | ORAL | Status: DC
  Administered 2023-07-07: 100 mg via ORAL
  Filled 2023-07-07: qty 2

## 2023-07-07 MED ORDER — AMLODIPINE BESYLATE 5 MG PO TABS
5.0000 mg | ORAL_TABLET | Freq: Once | ORAL | Status: AC
  Administered 2023-07-07: 5 mg via ORAL
  Filled 2023-07-07: qty 1

## 2023-07-07 MED ORDER — AMLODIPINE BESYLATE 5 MG PO TABS: 5.0000 mg | ORAL_TABLET | Freq: Every day | ORAL | 2 refills | Status: DC

## 2023-07-07 MED ORDER — LOSARTAN POTASSIUM 100 MG PO TABS: 100.0000 mg | ORAL_TABLET | Freq: Every day | ORAL | 2 refills | Status: DC

## 2023-07-07 NOTE — ED Triage Notes (Signed)
 Pt reports left sided chest pain, high blood pressure, and general weakness x 2 weeks. Pt reports not taking BP medication regularly.

## 2023-07-07 NOTE — ED Provider Notes (Signed)
 [place] EMERGENCY DEPARTMENT AT [facility] Provider Note   CSN: [id number] Arrival date & time: 07/07/23  1221     History  Chief Complaint  Patient presents with   Chest Pain    Joseph Adkins is a 58 y.o. male.  Patient is a 58yo male presenting for chest pain. Pt states he was halving and/or skipping his blood pressure medications over the past 3 months bc he went to [facility] to take care of his mom who was started on dialysis. States he didn't plan on being gone that long so he did not have enough. He returned to [facility]  last week but has been out of his meds for several weeks now. He has been noticing generalized weakness, dizziness, blurred vision, and headaches over the past week. Today he had left sided chest pain without radiation without nausea, diaphoresis, or sob. Pt states cp occurs both at rest and while active intermittently, 4/10 pain scale, lasting approx a couple of minutes prior to resolving. Denies current symptoms sitting in bed. Chest pain resolved. BP on arrival 218/133.   The history is provided by the patient. No language interpreter was used.  Chest Pain Associated symptoms: no abdominal pain, no back pain, no cough, no fever, no palpitations, no shortness of breath and no vomiting        Home Medications Prior to Admission medications   Medication Sig Start Date End Date Taking? Authorizing Provider  amLODipine  (NORVASC ) 5 MG tablet Take 1 tablet (5 mg total) by mouth daily. 07/07/23  Yes Elnor Hila P, DO  losartan  (COZAAR ) 100 MG tablet Take 1 tablet (100 mg total) by mouth daily. 07/07/23  Yes Elnor Hila P, DO  gabapentin  (NEURONTIN ) 300 MG capsule Take 1 capsule (300 mg total) by mouth at bedtime. 11/02/20   Newlin, Enobong, MD      Allergies    Patient has no known allergies.    Review of Systems   Review of Systems  Constitutional:  Negative for chills and fever.  HENT:  Negative for ear pain and sore throat.   Eyes:  Negative  for pain and visual disturbance.  Respiratory:  Negative for cough and shortness of breath.   Cardiovascular:  Positive for chest pain. Negative for palpitations.  Gastrointestinal:  Negative for abdominal pain and vomiting.  Genitourinary:  Negative for dysuria and hematuria.  Musculoskeletal:  Negative for arthralgias and back pain.  Skin:  Negative for color change and rash.  Neurological:  Negative for seizures and syncope.  All other systems reviewed and are negative.   Physical Exam Updated Vital Signs BP (!) 179/115   Pulse 80   Temp (!) 97.4 F (36.3 C) (Oral)   Resp 19   SpO2 100%  Physical Exam  ED Results / Procedures / Treatments   Labs (all labs ordered are listed, but only abnormal results are displayed) Labs Reviewed  BASIC METABOLIC PANEL - Abnormal; Notable for the following components:      Result Value   Glucose, Bld 146 (*)    Creatinine, Ser 1.32 (*)    All other components within normal limits  CBC  TROPONIN I (HIGH SENSITIVITY)  TROPONIN I (HIGH SENSITIVITY)    EKG EKG Interpretation Date/Time:  Friday July 07 2023 12:32:39 EST Ventricular Rate:  96 PR Interval:  142 QRS Duration:  84 QT Interval:  350 QTC Calculation: 442 R Axis:   -29  Text Interpretation: Normal sinus rhythm Possible Left atrial enlargement  Left ventricular hypertrophy ( R in aVL , Cornell product ) ST & T wave abnormality, consider lateral ischemia Abnormal ECG When compared with ECG of 17-Aug-2022 16:40, PREVIOUS ECG IS PRESENT Confirmed by Elnor Savant ([id number]) on 07/07/2023 3:41:05 PM  Radiology DG Chest 2 View Result Date: 07/07/2023 CLINICAL DATA:  Chest pain for 2 weeks. EXAM: CHEST - 2 VIEW COMPARISON:  08/17/2022 FINDINGS: The heart size and mediastinal contours are within normal limits. Both lungs are clear. The visualized skeletal structures are unremarkable. IMPRESSION: No active cardiopulmonary disease. Electronically Signed   By: Norleen DELENA Kil M.D.   On: 07/07/2023  13:46    Procedures Procedures    Medications Ordered in ED Medications  losartan  (COZAAR ) tablet 100 mg (100 mg Oral Given 07/07/23 1659)  hydrALAZINE  (APRESOLINE ) injection 5 mg (5 mg Intravenous Given 07/07/23 1659)  amLODipine  (NORVASC ) tablet 5 mg (5 mg Oral Given 07/07/23 1659)    ED Course/ Medical Decision Making/ A&P                                 Medical Decision Making Amount and/or Complexity of Data Reviewed Labs: ordered. Radiology: ordered.  Risk Prescription drug management.   58 year old male with past medical history of hypertension without his meds for several weeks presenting for complaints of chest pain, generalized weakness, blurred vision, headaches, with a blood pressure on arrival of 218/133.  My exam patient is alert and oriented x 3, no acute distress, afebrile, stable vital signs outside of blood pressure.  No cardiac murmurs.  Lung sounds are clear bilaterally with no adventitious lung noises.  No edema physical exam.  Differential diagnosis favors poorly controlled hypertension versus hypertensive emergency.  Lower on the list includes ACS, dissection, sepsis, stroke, etc.  EKG stable.  Troponin stable.  Electrolytes stable.  Troponin stable at 5 and 6.  Creatinine is 1.32 however he has been 1.3 on the last 3 lab draws.  No hypertensive emergency at this time.  Patient given calcium channel blocker in ED for blood pressure control as well as hydralazine .    Reevaluation patient has complete resolution of symptoms and eating chest pain.  His blood pressure is now down to 179/115.  Our goal is not to drop him too low and make him sick.  He is safe for discharge at this time with close follow-up with PCP for repeat blood pressure check and antihypertensive management.    Normally takes amlodipine  and losartan .  Will restart home meds and refill prescription for 2 months.  Mended for follow-up with PCP for further medication refills. Patient in no distress  and overall condition improved here in the ED. Detailed discussions were had with the patient regarding current findings, and need for close f/u with PCP or on call doctor. The patient has been instructed to return immediately if the symptoms worsen in any way for re-evaluation. Patient verbalized understanding and is in agreement with current care plan. All questions answered prior to discharge.         Final Clinical Impression(s) / ED Diagnoses Final diagnoses:  Poorly-controlled hypertension    Rx / DC Orders ED Discharge Orders          Ordered    amLODipine  (NORVASC ) 5 MG tablet  Daily        07/07/23 1800    losartan  (COZAAR ) 100 MG tablet  Daily        07/07/23 1800  Elnor Hila P, DO 07/07/23 1801

## 2023-07-07 NOTE — ED Notes (Signed)
 Patient d/c home with d/c orders and medications patient given strict return precautions to return to the ER if Sx worsen.

## 2023-07-07 NOTE — Discharge Instructions (Addendum)
 Symptoms that you presented today are likely secondary to your high blood pressure.  On arrival your blood pressure was 218/133.  Blood pressure upon discharge is 179/115.  Continue taking your medications daily to slowly bring your blood pressure down to a stable level.  Prescription sent to your pharmacy.  Please call make an appointment with your primary care physician for follow-up in 2 weeks for repeat blood pressure check and further blood pressure medication management.

## 2023-12-14 ENCOUNTER — Other Ambulatory Visit: Payer: Self-pay | Admitting: Family Medicine

## 2023-12-14 NOTE — Telephone Encounter (Signed)
 Copied from CRM [id number]. Topic: Clinical - Medication Refill >> Dec 14, 2023  2:50 PM Carla L wrote: Medication: amLODipine  (NORVASC ) 5 MG tablet losartan  (COZAAR ) 100 MG tablet Patient scheduled follow up with Dr. Newlin for 01/17/24. Patient requesting courtesy refill until able to be seen.   Has the patient contacted their pharmacy? Yes Told to contact the office for refills.   This is the patient's preferred pharmacy:  [id number] [place], [place] - [address] AT [facility] OF [facility] & [address] [place] [address] Phone: [phone] Fax: [phone]  Is this the correct pharmacy for this prescription? Yes If no, delete pharmacy and type the correct one.   Has the prescription been filled recently? No  Is the patient out of the medication? Yes  Has the patient been seen for an appointment in the last year OR does the patient have an upcoming appointment? Yes  Can we respond through MyChart? No  Agent: Please be advised that Rx refills may take up to 3 business days. We ask that you follow-up with your pharmacy.

## 2023-12-15 NOTE — Telephone Encounter (Signed)
 Requested medication (s) are due for refill today: yes  Requested medication (s) are on the active medication list: yes  Last refill:  07/07/23 #30/2  Future visit scheduled: yes 01/17/24  Notes to clinic:  pt due for appt, pt asking for courtesy to appt      Requested Prescriptions  Pending Prescriptions Disp Refills   losartan  (COZAAR ) 100 MG tablet 30 tablet 2    Sig: Take 1 tablet (100 mg total) by mouth daily.     Cardiovascular:  Angiotensin Receptor Blockers Failed - 12/15/2023  2:41 PM      Failed - Cr in normal range and within 180 days    Creatinine  Date Value Ref Range Status  03/30/2022 1.30 (H) 0.61 - 1.24 mg/dL Final   Creat  Date Value Ref Range Status  06/23/2015 1.23 0.70 - 1.33 mg/dL Final   Creatinine, Ser  Date Value Ref Range Status  07/07/2023 1.32 (H) 0.61 - 1.24 mg/dL Final         Failed - Last BP in normal range    BP Readings from Last 1 Encounters:  07/07/23 (!) 179/115         Failed - Valid encounter within last 6 months    Recent Outpatient Visits           1 year ago Chest pain, unspecified type   [place] [facility] Dept [facility] [place]. [facility] Delbert Clam, MD   1 year ago Screening for colon cancer   [place] [facility] [place] - A Dept [facility] [place]. [facility] Delbert Clam, MD   3 years ago MGUS (monoclonal gammopathy of unknown significance)   [place] [facility] [place] - A Dept [facility] [place]. [facility] Delbert Clam, MD   3 years ago Dizziness   [place] [facility] [place] - A Dept Of [place]. [facility] Delbert Clam, MD   3 years ago Pre-syncope   [place] [facility] [place] - A Dept Of [place]. [facility] [place], [place], MD              Passed - K in normal range and within 180 days    Potassium  Date Value Ref Range Status  07/07/2023 3.9 3.5 - 5.1 mmol/L Final         Passed - Patient is not pregnant       amLODipine   (NORVASC ) 5 MG tablet 30 tablet 2    Sig: Take 1 tablet (5 mg total) by mouth daily.     Cardiovascular: Calcium Channel Blockers 2 Failed - 12/15/2023  2:41 PM      Failed - Last BP in normal range    BP Readings from Last 1 Encounters:  07/07/23 (!) 179/115         Failed - Valid encounter within last 6 months    Recent Outpatient Visits           1 year ago Chest pain, unspecified type   [place] [facility] - A Dept Of [place]. [facility] Delbert Clam, MD   1 year ago Screening for colon cancer   [place] [facility] [place] - A Dept Of [place]. [facility] Delbert Clam, MD   3 years ago MGUS (monoclonal gammopathy of unknown significance)    [facility] [place] - A Dept Of [place]. [facility] Delbert Clam, MD  3 years ago Dizziness   [place] [facility] [place] - A Dept Of [place]. [facility] Delbert Clam, MD   3 years ago Pre-syncope   [place] [facility] [place] - A Dept Of [place]. [facility] [place], S[place], MD              Passed - Last Heart Rate in normal range    Pulse Readings from Last 1 Encounters:  07/07/23 80

## 2023-12-18 MED ORDER — AMLODIPINE BESYLATE 5 MG PO TABS: 5.0000 mg | ORAL_TABLET | Freq: Every day | ORAL | 2 refills | Status: DC

## 2023-12-18 MED ORDER — LOSARTAN POTASSIUM 100 MG PO TABS: 100.0000 mg | ORAL_TABLET | Freq: Every day | ORAL | 2 refills | Status: DC

## 2024-01-16 ENCOUNTER — Telehealth: Payer: Self-pay | Admitting: Family Medicine

## 2024-01-16 NOTE — Telephone Encounter (Signed)
 Pt unconfirmed appt 8/19

## 2024-01-17 ENCOUNTER — Ambulatory Visit: Payer: Self-pay | Admitting: Family Medicine

## 2024-03-22 ENCOUNTER — Other Ambulatory Visit: Payer: Self-pay | Admitting: Family Medicine

## 2024-03-23 NOTE — Telephone Encounter (Signed)
 Requested Prescriptions  Pending Prescriptions Disp Refills   amLODipine  (NORVASC ) 5 MG tablet [Pharmacy Med Name: AMLODIPINE  BESYLATE 5MG  TABLETS] 90 tablet 0    Sig: TAKE 1 TABLET(5 MG) BY MOUTH DAILY     Cardiovascular: Calcium Channel Blockers 2 Failed - 03/23/2024  9:24 PM      Failed - Last BP in normal range    BP Readings from Last 1 Encounters:  07/07/23 (!) 179/115         Failed - Valid encounter within last 6 months    Recent Outpatient Visits           1 year ago Chest pain, unspecified type   [place] [facility] - [facility] Dept Of [place]. [facility] Delbert Clam, MD   1 year ago Screening for colon cancer   [place] [facility] [place] - A Dept Of [place]. [facility] Delbert Clam, MD   3 years ago MGUS (monoclonal gammopathy of unknown significance)   [place] [facility] [place] - A Dept Of [place]. [facility] Delbert Clam, MD   3 years ago Dizziness   [place] [facility] [place] - A Dept Of [place]. [facility] Delbert Clam, MD   4 years ago Pre-syncope   [employer] [facility] [place] - A Dept Of [place]. [facility] [place], [place], MD              Passed - Last Heart Rate in normal range    Pulse Readings from Last 1 Encounters:  07/07/23 80

## 2024-03-28 ENCOUNTER — Other Ambulatory Visit: Payer: Self-pay | Admitting: Family Medicine

## 2024-03-28 NOTE — Telephone Encounter (Signed)
 Copied from CRM [id number]. Topic: Clinical - Medication Refill >> Mar 28, 2024  2:37 PM Carla L wrote: Medication: losartan  (COZAAR ) 100 MG tablet  Has the patient contacted their pharmacy? Yes Told to contact the office.   This is the patient's preferred pharmacy:  [facility] #[id number] [place], [place] - [address] AT [facility] OF [facility] & [address] [place] [address] Phone: [phone] Fax: [phone]  Is this the correct pharmacy for this prescription? Yes  Has the prescription been filled recently? No  Is the patient out of the medication? Yes  Has the patient been seen for an appointment in the last year OR does the patient have an upcoming appointment? Yes  Can we respond through MyChart? No  Agent: Please be advised that Rx refills may take up to 3 business days. We ask that you follow-up with your pharmacy.

## 2024-03-29 NOTE — Telephone Encounter (Signed)
 Requested medications are due for refill today.  yes  Requested medications are on the active medications list.  yes  Last refill. 12/18/2023 #30 2 rf  Future visit scheduled.   yes  Notes to clinic.  Labs are expired    Requested Prescriptions  Pending Prescriptions Disp Refills   losartan  (COZAAR ) 100 MG tablet 30 tablet 2    Sig: Take 1 tablet (100 mg total) by mouth daily.     Cardiovascular:  Angiotensin Receptor Blockers Failed - 03/29/2024  5:48 PM      Failed - Cr in normal range and within 180 days    Creatinine  Date Value Ref Range Status  03/30/2022 1.30 (H) 0.61 - 1.24 mg/dL Final   Creat  Date Value Ref Range Status  06/23/2015 1.23 0.70 - 1.33 mg/dL Final   Creatinine, Ser  Date Value Ref Range Status  07/07/2023 1.32 (H) 0.61 - 1.24 mg/dL Final         Failed - K in normal range and within 180 days    Potassium  Date Value Ref Range Status  07/07/2023 3.9 3.5 - 5.1 mmol/L Final         Failed - Last BP in normal range    BP Readings from Last 1 Encounters:  07/07/23 (!) 179/115         Failed - Valid encounter within last 6 months    Recent Outpatient Visits           1 year ago Chest pain, unspecified type   [place] [facility] - [facility] Dept Of [place]. [facility] Delbert Clam, MD   1 year ago Screening for colon cancer   [place] [facility] [place] - A Dept Of [place]. [facility] Delbert Clam, MD   3 years ago MGUS (monoclonal gammopathy of unknown significance)   [place] [facility] [place] - [facility] Dept [place]. [facility] Delbert Clam, MD   3 years ago Dizziness   [place] [facility] [place] - A Dept Of [place]. [facility] Delbert Clam, MD   4 years ago Pre-syncope    [facility] [place] - A Dept Of [place]. [facility] Alec House, MD              Passed - Patient is not pregnant

## 2024-04-01 MED ORDER — LOSARTAN POTASSIUM 100 MG PO TABS: 100.0000 mg | ORAL_TABLET | Freq: Every day | ORAL | 0 refills | Status: AC

## 2024-04-02 ENCOUNTER — Other Ambulatory Visit: Payer: Self-pay | Admitting: Family Medicine

## 2024-05-01 ENCOUNTER — Ambulatory Visit: Payer: Self-pay | Admitting: Family Medicine

## 2024-06-24 ENCOUNTER — Telehealth: Payer: Self-pay | Admitting: Family Medicine

## 2024-06-24 ENCOUNTER — Ambulatory Visit: Payer: Self-pay | Admitting: Family Medicine

## 2024-06-24 NOTE — Telephone Encounter (Signed)
 Contacted patient and mailbox full to leave a  voicemail requesting to change todays appointment to virtual or to reschedule. No in-person appointments today due to weather. If patient calls back, please switch appointment accordingly.

## 2024-06-24 NOTE — Telephone Encounter (Signed)
 Contacted pt no vm (mailbox full )to send a message to let pt know office is closed appts are virtual (2nd attempt)
